# Patient Record
Sex: Male | Born: 1935 | Race: White | Hispanic: No | Marital: Married | Smoking: Former smoker
Health system: Southern US, Community
[De-identification: ages and names within clinical notes are randomized; demographics above are authoritative.]

## PROBLEM LIST (undated history)

## (undated) DIAGNOSIS — N200 Calculus of kidney: Secondary | ICD-10-CM

## (undated) DIAGNOSIS — N400 Enlarged prostate without lower urinary tract symptoms: Secondary | ICD-10-CM

## (undated) DIAGNOSIS — J45909 Unspecified asthma, uncomplicated: Secondary | ICD-10-CM

## (undated) DIAGNOSIS — Z87442 Personal history of urinary calculi: Secondary | ICD-10-CM

## (undated) DIAGNOSIS — K219 Gastro-esophageal reflux disease without esophagitis: Secondary | ICD-10-CM

## (undated) DIAGNOSIS — G4730 Sleep apnea, unspecified: Secondary | ICD-10-CM

## (undated) DIAGNOSIS — Q984 Klinefelter syndrome, unspecified: Secondary | ICD-10-CM

## (undated) DIAGNOSIS — B029 Zoster without complications: Secondary | ICD-10-CM

## (undated) DIAGNOSIS — E039 Hypothyroidism, unspecified: Secondary | ICD-10-CM

## (undated) DIAGNOSIS — M109 Gout, unspecified: Secondary | ICD-10-CM

## (undated) DIAGNOSIS — I639 Cerebral infarction, unspecified: Secondary | ICD-10-CM

## (undated) DIAGNOSIS — K5790 Diverticulosis of intestine, part unspecified, without perforation or abscess without bleeding: Secondary | ICD-10-CM

## (undated) DIAGNOSIS — A770 Spotted fever due to Rickettsia rickettsii: Secondary | ICD-10-CM

## (undated) DIAGNOSIS — I10 Essential (primary) hypertension: Secondary | ICD-10-CM

## (undated) DIAGNOSIS — J449 Chronic obstructive pulmonary disease, unspecified: Secondary | ICD-10-CM

## (undated) HISTORY — PX: JOINT REPLACEMENT: SHX530

---

## 2006-10-25 ENCOUNTER — Ambulatory Visit: Payer: Self-pay | Admitting: Internal Medicine

## 2007-01-27 ENCOUNTER — Inpatient Hospital Stay: Payer: Self-pay | Admitting: Unknown Physician Specialty

## 2007-11-14 ENCOUNTER — Ambulatory Visit: Payer: Self-pay | Admitting: Gastroenterology

## 2007-12-16 ENCOUNTER — Ambulatory Visit: Payer: Self-pay | Admitting: Gastroenterology

## 2009-05-04 ENCOUNTER — Ambulatory Visit: Payer: Self-pay | Admitting: Neurology

## 2009-11-17 ENCOUNTER — Ambulatory Visit: Payer: Self-pay | Admitting: Unknown Physician Specialty

## 2010-01-05 ENCOUNTER — Ambulatory Visit: Payer: Self-pay | Admitting: Unknown Physician Specialty

## 2010-01-09 ENCOUNTER — Ambulatory Visit: Payer: Self-pay | Admitting: Unknown Physician Specialty

## 2011-01-12 ENCOUNTER — Ambulatory Visit: Payer: Self-pay | Admitting: Gastroenterology

## 2011-01-15 LAB — PATHOLOGY REPORT

## 2011-01-19 ENCOUNTER — Ambulatory Visit: Payer: Self-pay | Admitting: Gastroenterology

## 2011-05-23 ENCOUNTER — Emergency Department: Payer: Self-pay | Admitting: Emergency Medicine

## 2011-06-08 ENCOUNTER — Ambulatory Visit: Payer: Self-pay | Admitting: Internal Medicine

## 2011-12-19 ENCOUNTER — Ambulatory Visit: Payer: Self-pay | Admitting: Unknown Physician Specialty

## 2012-01-04 ENCOUNTER — Encounter: Payer: Self-pay | Admitting: Unknown Physician Specialty

## 2012-01-11 ENCOUNTER — Encounter: Payer: Self-pay | Admitting: Unknown Physician Specialty

## 2012-08-15 ENCOUNTER — Ambulatory Visit: Payer: Self-pay | Admitting: Internal Medicine

## 2012-09-02 ENCOUNTER — Ambulatory Visit: Payer: Self-pay | Admitting: Internal Medicine

## 2012-09-17 ENCOUNTER — Ambulatory Visit: Payer: Self-pay | Admitting: Unknown Physician Specialty

## 2012-09-23 ENCOUNTER — Other Ambulatory Visit: Payer: Self-pay | Admitting: Unknown Physician Specialty

## 2012-09-23 LAB — APTT: Activated PTT: 27.6 secs (ref 23.6–35.9)

## 2012-09-23 LAB — PLATELET FUNCTION ASSAY: COL/EPI PLT FXN SCRN: 206 Seconds — ABNORMAL HIGH

## 2012-09-23 LAB — PROTIME-INR: INR: 1

## 2012-09-29 ENCOUNTER — Ambulatory Visit: Payer: Self-pay | Admitting: Unknown Physician Specialty

## 2012-10-08 ENCOUNTER — Ambulatory Visit: Payer: Self-pay | Admitting: Unknown Physician Specialty

## 2012-10-14 ENCOUNTER — Ambulatory Visit: Payer: Self-pay | Admitting: Unknown Physician Specialty

## 2012-10-15 LAB — PATHOLOGY REPORT

## 2013-06-11 ENCOUNTER — Ambulatory Visit: Payer: Self-pay | Admitting: Hematology and Oncology

## 2013-06-16 ENCOUNTER — Ambulatory Visit: Payer: Self-pay | Admitting: Gastroenterology

## 2013-06-30 ENCOUNTER — Ambulatory Visit: Payer: Self-pay | Admitting: Gastroenterology

## 2013-06-30 LAB — CBC WITH DIFFERENTIAL/PLATELET
Basophil %: 0.8 %
HCT: 38.9 % — ABNORMAL LOW (ref 40.0–52.0)
HGB: 13.1 g/dL (ref 13.0–18.0)
MCH: 30.1 pg (ref 26.0–34.0)
MCHC: 33.6 g/dL (ref 32.0–36.0)
MCV: 89 fL (ref 80–100)
Monocyte #: 0.5 x10 3/mm (ref 0.2–1.0)
Monocyte %: 8.7 %
Neutrophil %: 49.1 %
WBC: 5.3 10*3/uL (ref 3.8–10.6)

## 2013-07-02 LAB — PATHOLOGY REPORT

## 2013-07-07 ENCOUNTER — Ambulatory Visit: Payer: Self-pay | Admitting: Hematology and Oncology

## 2013-07-07 LAB — CBC CANCER CENTER
Basophil #: 0.1 x10 3/mm (ref 0.0–0.1)
Eosinophil %: 2.6 %
HCT: 38.4 % — ABNORMAL LOW (ref 40.0–52.0)
HGB: 13.3 g/dL (ref 13.0–18.0)
Lymphocyte #: 1.8 x10 3/mm (ref 1.0–3.6)
MCV: 89 fL (ref 80–100)
Monocyte #: 0.5 x10 3/mm (ref 0.2–1.0)
Neutrophil #: 4.1 x10 3/mm (ref 1.4–6.5)
Platelet: 81 x10 3/mm — ABNORMAL LOW (ref 150–440)
RBC: 4.3 10*6/uL — ABNORMAL LOW (ref 4.40–5.90)
WBC: 6.7 x10 3/mm (ref 3.8–10.6)

## 2013-07-10 ENCOUNTER — Ambulatory Visit: Payer: Self-pay | Admitting: Hematology and Oncology

## 2013-10-06 ENCOUNTER — Ambulatory Visit: Payer: Self-pay | Admitting: Hematology and Oncology

## 2013-10-06 LAB — CBC CANCER CENTER
Basophil #: 0 x10 3/mm (ref 0.0–0.1)
Eosinophil %: 2.7 %
Lymphocyte #: 1.7 x10 3/mm (ref 1.0–3.6)
MCH: 30.5 pg (ref 26.0–34.0)
MCV: 90 fL (ref 80–100)
Monocyte #: 0.5 x10 3/mm (ref 0.2–1.0)
Monocyte %: 9.5 %
Platelet: 81 x10 3/mm — ABNORMAL LOW (ref 150–440)
RBC: 4.31 10*6/uL — ABNORMAL LOW (ref 4.40–5.90)
RDW: 13.3 % (ref 11.5–14.5)
WBC: 5.4 x10 3/mm (ref 3.8–10.6)

## 2013-10-07 IMAGING — US US THYROID
1 series · 17 of 25 positions shown · non-contrast
Comparison: none

REASON FOR EXAM: thyroid nodule
COMMENTS:

[Series 1: us thyroid · 17 of 50 slices shown]
[im 1/50]
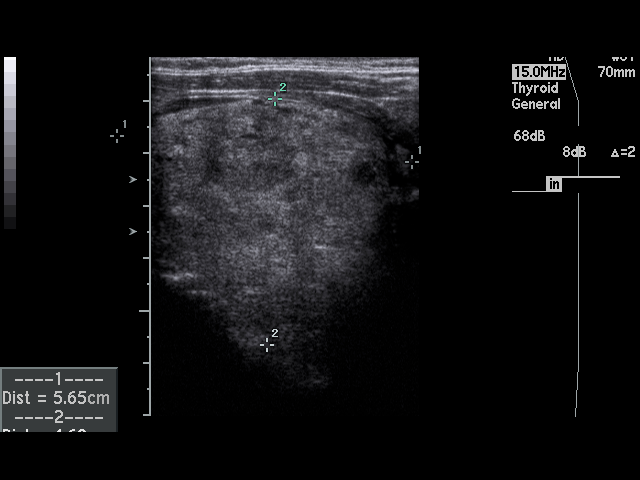
[im 5/50]
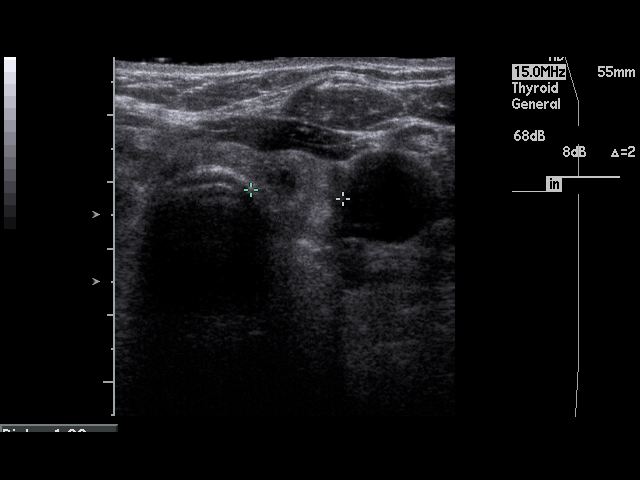
[im 7/50]
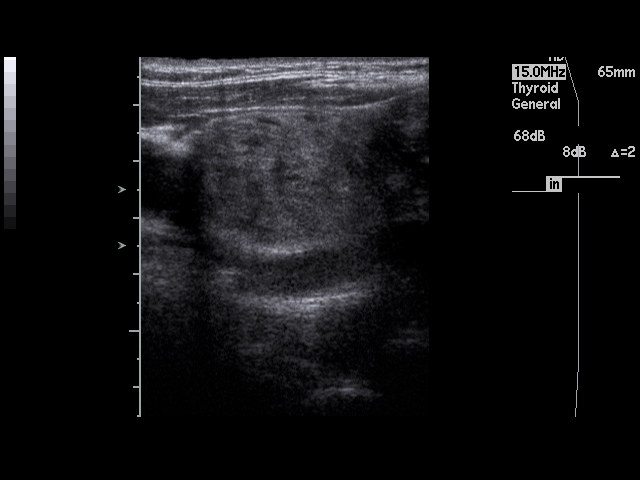
[im 11/50]
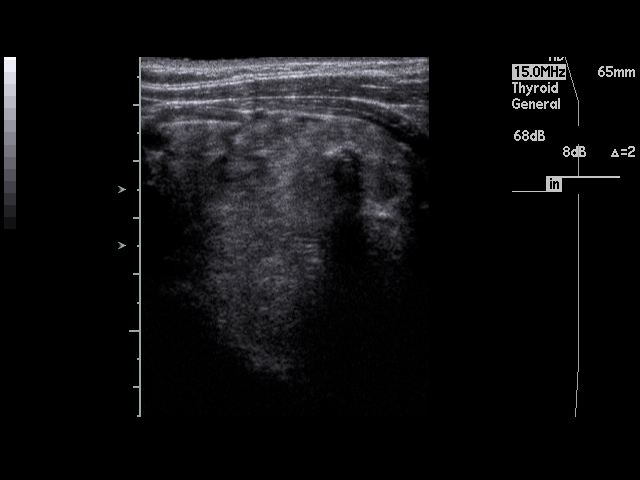
[im 13/50]
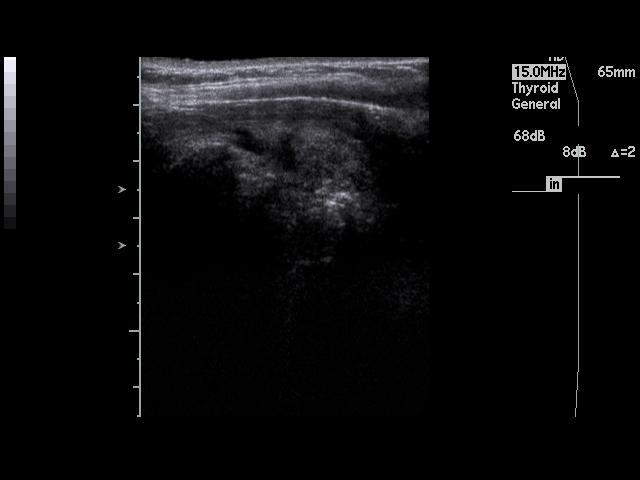
[im 17/50]
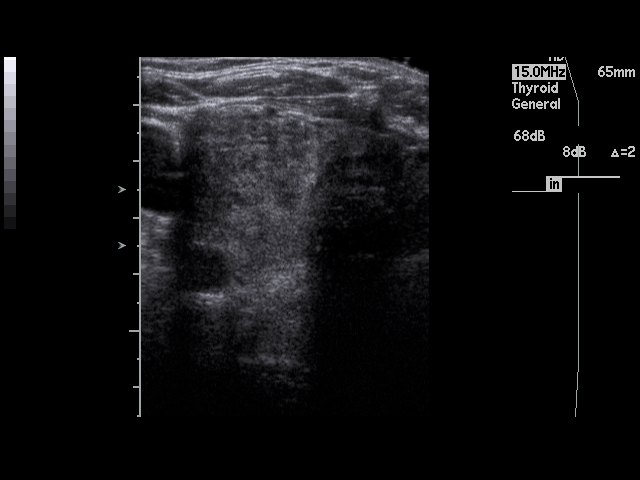
[im 19/50]
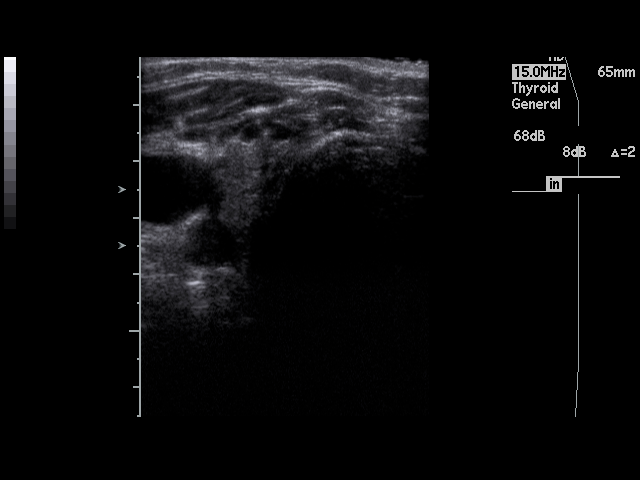
[im 23/50]
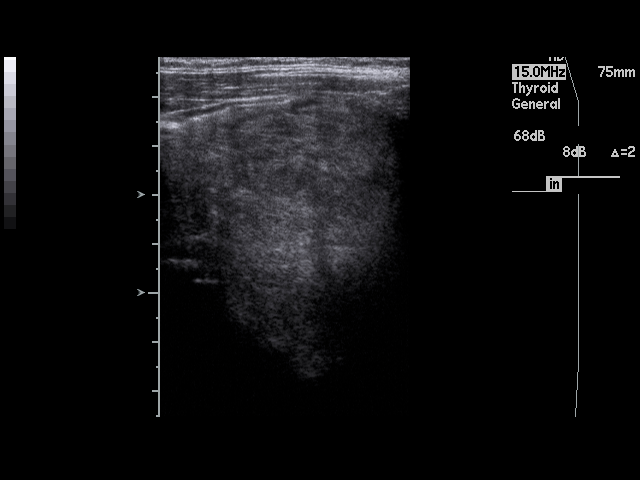
[im 25/50]
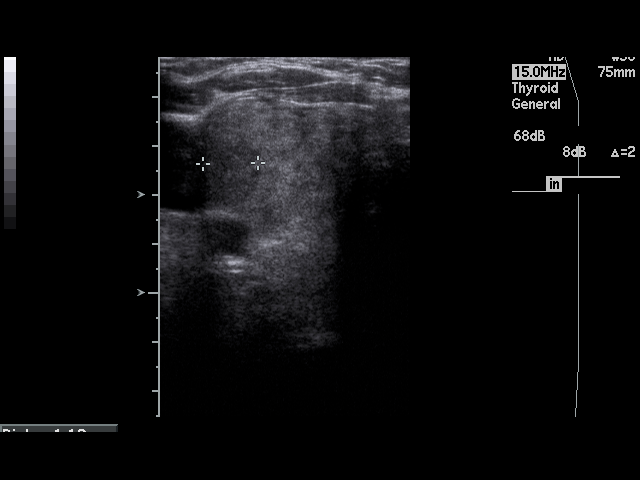
[im 27/50]
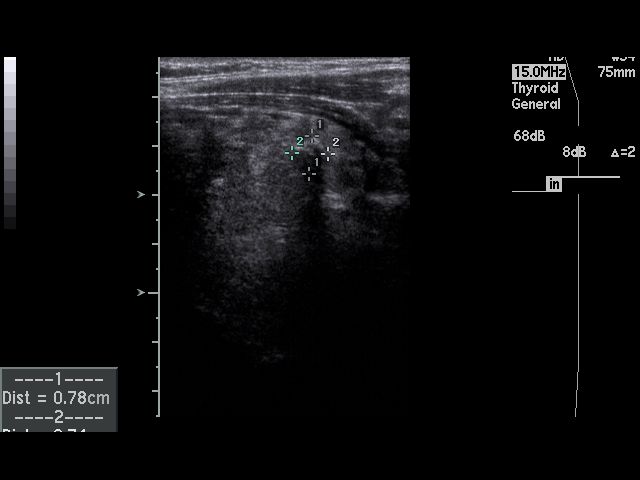
[im 31/50]
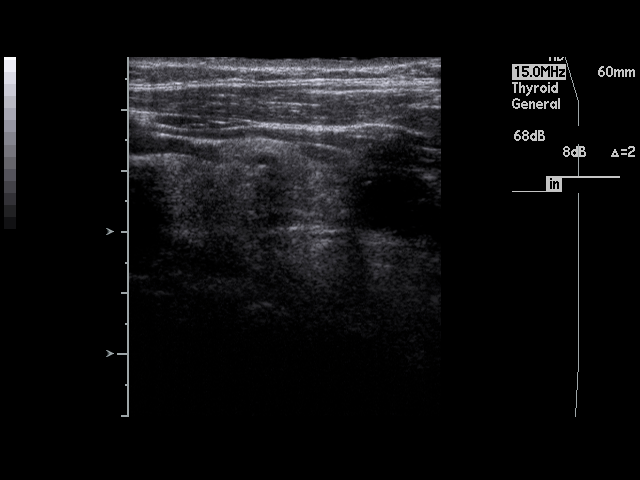
[im 33/50]
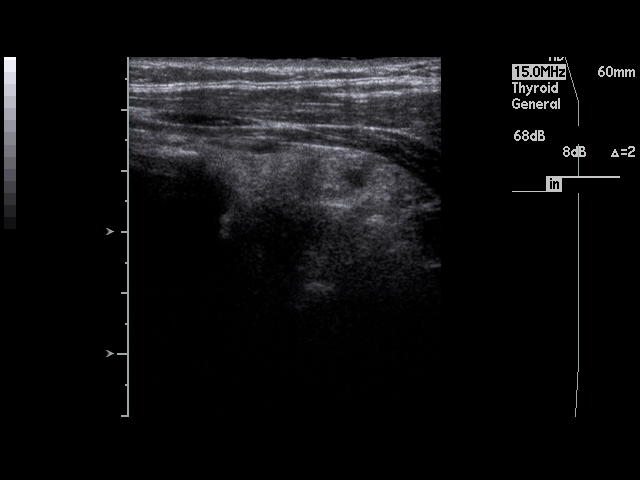
[im 37/50]
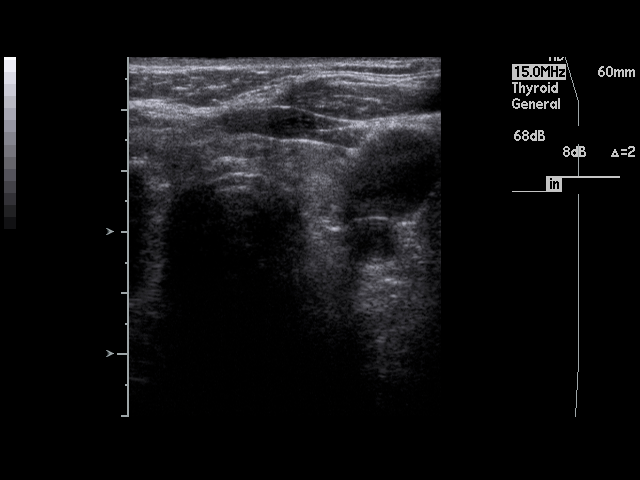
[im 39/50]
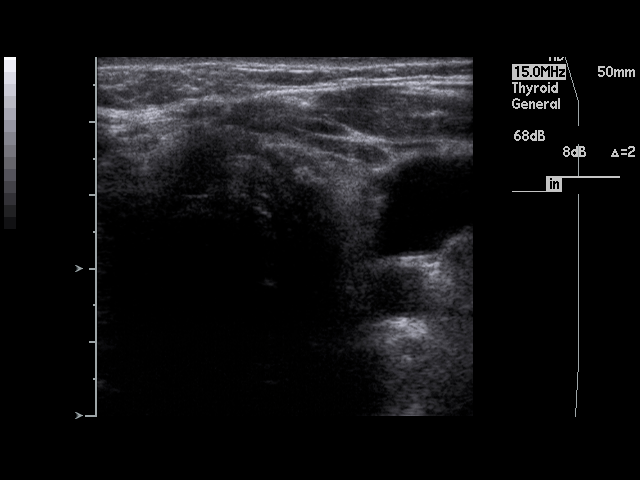
[im 43/50]
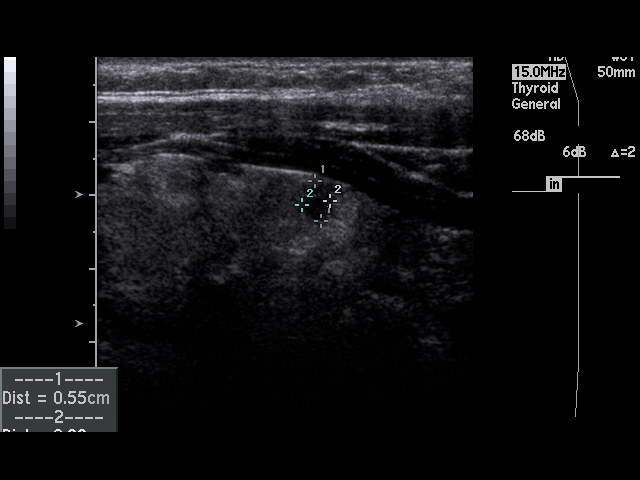
[im 45/50]
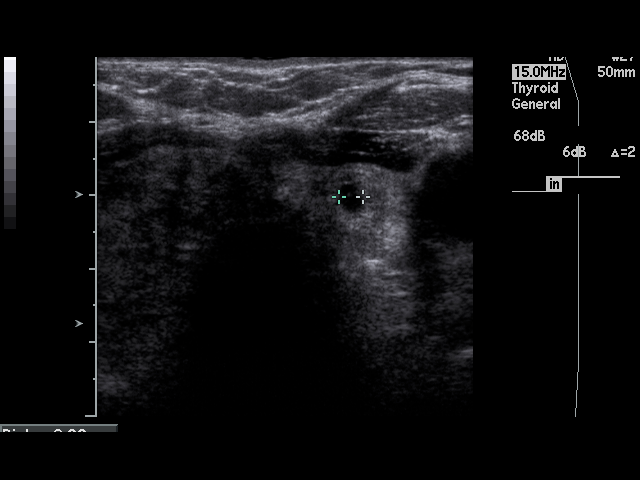
[im 50/50]
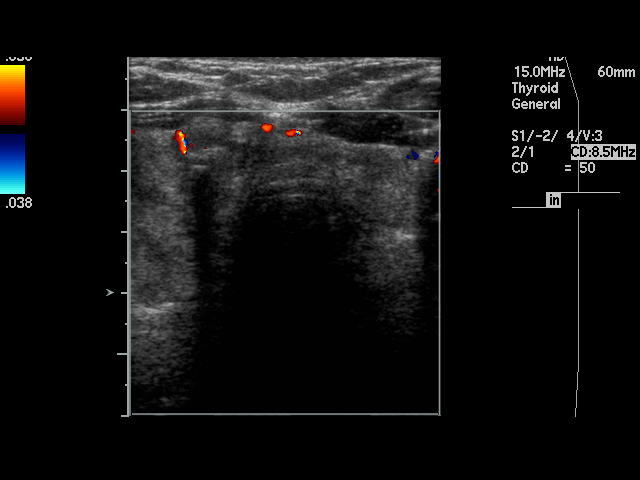

[17 of 25 positions shown; findings below may reference images not displayed]

PROCEDURE:     MDZ - MDZ THYROID  - December 19, 2011  [DATE]

RESULT:     The right lobe of the thyroid measures 5.65 cm x 4.69 cm x
cm and the left lobe measures 4.68 cm x 2.28 cm x 1.39 cm. There is a
cm hypoechoic nodule in the mid to upper pole region of the right lobe.
There is a focal 7.8 mm calcification at the lower pole of the right lobe.
No associated nodule is seen. On the left, there is a tiny lobulated or
septated nodule that is near anechoic. No associated microcalcifications are
seen. No additional nodules are seen. The thyroid echotexture bilaterally is
heterogeneous. In view of this appearance the possibility of additional
non-circumscribed nodules cannot be excluded.
IMPRESSION: 1. There is a 1.84 cm hypoechoic nodule at the mid to upper pole of the
right lobe.
2. There is a 5.5 mm anechoic or near anechoic nodule at the lower pole of
the left lobe.
3. There is a focal calcification at the lower pole that is unassociated
with a nodule.
4. The thyroid echotexture bilaterally is heterogeneous.

## 2013-10-10 ENCOUNTER — Ambulatory Visit: Payer: Self-pay | Admitting: Hematology and Oncology

## 2015-03-24 ENCOUNTER — Ambulatory Visit
Admit: 2015-03-24 | Disposition: A | Discharge status: Home / Self Care | Payer: Self-pay | Admitting: Unknown Physician Specialty

## 2015-03-29 NOTE — Op Note (Signed)
PATIENT NAME:  DOCTOR, SHEAHAN MR#:  [id number] DATE OF BIRTH:  05/26/1934  DATE OF PROCEDURE:  10/14/2012  PREOPERATIVE DIAGNOSIS: Right thyroid mass.   POSTOPERATIVE DIAGNOSIS: Right thyroid mass.   OPERATION PERFORMED:  Right hemithyroidectomy with isthmusectomy.   SURGEON: Roena Malady, MD   ASSISTANT: Mahlon Gammon, MD   OPERATIVE FINDINGS: A large multinodular mass within the right lobe of the thyroid.   DESCRIPTION OF PROCEDURE: Grason was identified in the holding area, taken to the Operating Room, and placed in the supine position. After general endotracheal anesthesia, the neck was gently extended. An incision line was marked approximately two fingerbreadths above the sternal notch. A local anesthetic of 1% lidocaine with 1:100,000 epinephrine was used to inject along this incision line. A total of 5 mL was used. With the neck prepped and draped sterilely, a 15 blade was used to incise down to and through the platysma muscle. Hemostasis was achieved using the Bovie cautery. The strap muscles were identified in the midline and gently divided using the Bovie. The strap muscles were retracted to the right. Immediately the right lobe of the thyroid was identified as a large mass or a large lobe with multiple nodules within it. The gland was gently medialized and all feeding vessels were divided using the Harmonic scalpel. The superior pole was identified and isolated and divided using the Harmonic scalpel. This allowed the gland to gently medialize. The superior and inferior parathyroid glands were identified and left on their vascular pedicles intact. The gland was then retracted more medially. Careful dissection proceeded within the tracheoesophageal groove where the recurrent laryngeal nerve was identified. This was stimulated and was left intact throughout the case. Prior to the surgery, a laryngeal monitor had been placed in the endotracheal tube and was used for monitoring throughout the  case. As the gland was then medialized, Berry's ligament was identified and divided. The gland was then peeled off the anterior tracheal wall crossing the midline, including the isthmus. The gland was then removed from its opposite lobe using the Harmonic scalpel.  I thus delivered the gland. A stitch was placed in the superior pole. There were multiple nodules throughout the gland. The gland was then inspected on the back table. There did not appear to be any parathyroid tissue identifiable. The wound was then copiously irrigated with saline. Any small bleeding points were cauterized using the microbipolar. The recurrent laryngeal nerve was stimulated at the end of the case and remained intact. A piece of Surgicel was then placed against the trachea on the right hand side. A #10 TLS drain was brought out of the wound inferiorly. The strap muscles were reapproximated midline using 4-0 Vicryl. The platysma was closed using 4-0 Vicryl, and the skin was closed using subcutaneous tissues using 4-0 Vicryl, and the skin was closed using Dermabond. A Tegaderm was then placed over the drain to keep it in place. This was then fixed to the suction tubing to prevent hematoma. The patient was then returned to Anesthesia where he was awakened in the Operating Room and taken to the recovery room in stable condition.   CULTURES: None.   SPECIMENS: Right thyroid lobe and isthmus.   ESTIMATED BLOOD LOSS: Less than 20 mL.  ____________________________ Roena Malady, MD ctm:cbb D: 10/14/2012 12:11:15 ET T: 10/14/2012 12:17:35 ET JOB#: [id number]  cc: Roena Malady, MD, <Dictator> Roena Malady MD ELECTRONICALLY SIGNED 10/21/2012 17:27

## 2015-06-23 ENCOUNTER — Encounter: Payer: Self-pay | Admitting: Emergency Medicine

## 2015-06-23 ENCOUNTER — Emergency Department: Payer: PPO

## 2015-06-23 ENCOUNTER — Emergency Department
Admission: EM | Admit: 2015-06-23 | Discharge: 2015-06-23 | Disposition: A | Discharge status: Home / Self Care | Payer: PPO | Attending: Emergency Medicine | Admitting: Emergency Medicine

## 2015-06-23 DIAGNOSIS — Z23 Encounter for immunization: Secondary | ICD-10-CM | POA: Insufficient documentation

## 2015-06-23 DIAGNOSIS — Z87891 Personal history of nicotine dependence: Secondary | ICD-10-CM | POA: Diagnosis not present

## 2015-06-23 DIAGNOSIS — S0990XA Unspecified injury of head, initial encounter: Secondary | ICD-10-CM | POA: Diagnosis present

## 2015-06-23 DIAGNOSIS — Y998 Other external cause status: Secondary | ICD-10-CM | POA: Insufficient documentation

## 2015-06-23 DIAGNOSIS — Y9289 Other specified places as the place of occurrence of the external cause: Secondary | ICD-10-CM | POA: Diagnosis not present

## 2015-06-23 DIAGNOSIS — Y9389 Activity, other specified: Secondary | ICD-10-CM | POA: Insufficient documentation

## 2015-06-23 DIAGNOSIS — W010XXA Fall on same level from slipping, tripping and stumbling without subsequent striking against object, initial encounter: Secondary | ICD-10-CM | POA: Insufficient documentation

## 2015-06-23 DIAGNOSIS — S0101XA Laceration without foreign body of scalp, initial encounter: Secondary | ICD-10-CM | POA: Insufficient documentation

## 2015-06-23 HISTORY — DX: Chronic obstructive pulmonary disease, unspecified: J44.9

## 2015-06-23 HISTORY — DX: Unspecified asthma, uncomplicated: J45.909

## 2015-06-23 MED ORDER — ACETAMINOPHEN 500 MG PO TABS
ORAL_TABLET | ORAL | Status: AC
  Administered 2015-06-23: 1000 mg via ORAL
  Filled 2015-06-23: qty 2

## 2015-06-23 MED ORDER — LIDOCAINE-EPINEPHRINE (PF) 1 %-1:200000 IJ SOLN
INTRAMUSCULAR | Status: AC
  Filled 2015-06-23: qty 30

## 2015-06-23 MED ORDER — ACETAMINOPHEN 500 MG PO TABS
1000.0000 mg | ORAL_TABLET | Freq: Once | ORAL | Status: AC
  Administered 2015-06-23: 1000 mg via ORAL

## 2015-06-23 MED ORDER — TETANUS-DIPHTH-ACELL PERTUSSIS 5-2.5-18.5 LF-MCG/0.5 IM SUSP
0.5000 mL | Freq: Once | INTRAMUSCULAR | Status: AC
  Administered 2015-06-23: 0.5 mL via INTRAMUSCULAR

## 2015-06-23 MED ORDER — TETANUS-DIPHTH-ACELL PERTUSSIS 5-2.5-18.5 LF-MCG/0.5 IM SUSP
INTRAMUSCULAR | Status: AC
  Administered 2015-06-23: 0.5 mL via INTRAMUSCULAR
  Filled 2015-06-23: qty 0.5

## 2015-06-23 NOTE — ED Provider Notes (Signed)
[facility] Emergency Department Provider Note     Time seen: ----------------------------------------- 6:22 PM on 06/23/2015 -----------------------------------------    I have reviewed the triage vital signs and the nursing notes.   HISTORY  Chief Complaint Head Laceration    HPI Richard Fry is a 81 y.o. male who presents ER after a fall. Patient states he sustained a laceration above his right eyebrow. Patient states he was on the telephone and his legs got tangled and he fell, thinks he may have hit his head on the floor. He denies any significant headache, denies taking blood thinners. He is unsure when his last vaccination was.   Past Medical History  Diagnosis Date  . COPD (chronic obstructive pulmonary disease)   . Asthma     There are no active problems to display for this patient.   History reviewed. No pertinent past surgical history.  Allergies Review of patient's allergies indicates no known allergies.  Social History History  Substance Use Topics  . Smoking status: Former Smoker    Types: Cigarettes  . Smokeless tobacco: Never Used  . Alcohol Use: No    Review of Systems Constitutional: Negative for fever. Eyes: Negative for visual changes. ENT: Negative for sore throat. Cardiovascular: Negative for chest pain. Respiratory: Negative for shortness of breath. Gastrointestinal: Negative for abdominal pain, vomiting and diarrhea. Genitourinary: Negative for dysuria. Musculoskeletal: Negative for back pain. Skin: Negative for rash. Neurological: Negative for headaches, focal weakness or numbness.  10-point ROS otherwise negative.  ____________________________________________   PHYSICAL EXAM:  VITAL SIGNS: ED Triage Vitals  Enc Vitals Group     BP 06/23/15 1733 137/66 mmHg     Pulse Rate 06/23/15 1733 78     Resp 06/23/15 1733 18     Temp 06/23/15 1733 98.4 F (36.9 C)     Temp src --      SpO2 06/23/15 1733 94  %     Weight --      Height --      Head Cir --      Peak Flow --      Pain Score 06/23/15 1735 4     Pain Loc --      Pain Edu? --      Excl. in [place]? --     Constitutional: Alert and oriented. Well appearing and in no distress. Eyes: Conjunctivae are normal. PERRL. Normal extraocular movements. ENT   Head: 5 cm laceration noted superior to the right eyebrow.   Nose: No congestion/rhinnorhea.   Mouth/Throat: Mucous membranes are moist.   Neck: No stridor. Hematological/Lymphatic/Immunilogical: No cervical lymphadenopathy. Cardiovascular: Normal rate, regular rhythm. Normal and symmetric distal pulses are present in all extremities. No murmurs, rubs, or gallops. Respiratory: Normal respiratory effort without tachypnea nor retractions. Breath sounds are clear and equal bilaterally. No wheezes/rales/rhonchi. Gastrointestinal: Soft and nontender. No distention. No abdominal bruits. There is no CVA tenderness. Musculoskeletal: Nontender with normal range of motion in all extremities. No joint effusions.  No lower extremity tenderness nor edema. Neurologic:  Normal speech and language. No gross focal neurologic deficits are appreciated. Speech is normal. No gait instability. Skin:  5 cm laceration as noted above the right eyebrow, Y-shaped Psychiatric: Mood and affect are normal. Speech and behavior are normal. Patient exhibits appropriate insight and judgment.  ____________________________________________  ED COURSE:  Pertinent labs & imaging results that were available during my care of the patient were reviewed by me and considered in my medical decision making (see chart  for details). We'll obtain CT head, laceration repair and T [place]. ____________________________________________ RADIOLOGY Images were viewed by me  CT head without traumatic injury   LACERATION REPAIR Performed by: Earleen Newport Authorized by: Lenise Arena E Consent: Verbal consent  obtained. Risks and benefits: risks, benefits and alternatives were discussed Consent given by: patient Patient identity confirmed: provided demographic data Prepped and Draped in normal sterile fashion Wound explored  Laceration Location: Right frontal scalp  Laceration Length: 5 cm, complex Y-shaped  No Foreign Bodies seen or palpated  Anesthesia: local infiltration  Local anesthetic: lidocaine 1 % with epinephrine  Anesthetic total: 5 ml  Irrigation method: syringe Amount of cleaning: standard  Skin closure: 5-0 Ethilon   Number of sutures: 15  Technique: Running   Patient tolerance: Patient tolerated the procedure well with no immediate complications. ____________________________________________  FINAL ASSESSMENT AND PLAN  Fall, head injury, complex scalp laceration  Plan: Wound was repaired as dictated above. CT scan findings as dictated above. Patient being discharged with close follow-up reevaluation, suture removal in around 7 days.   Earleen Newport, MD   Earleen Newport, MD 06/23/15 [phone]

## 2015-06-23 NOTE — ED Notes (Signed)
MD at bedside. 

## 2015-06-23 NOTE — Discharge Instructions (Signed)
Head Injury °You have received a head injury. It does not appear serious at this time. Headaches and vomiting are common following head injury. It should be easy to awaken from sleeping. Sometimes it is necessary for you to stay in the emergency department for a while for observation. Sometimes admission to the hospital may be needed. After injuries such as yours, most problems occur within the first 24 hours, but side effects may occur up to 7-10 days after the injury. It is important for you to carefully monitor your condition and contact your health care provider or seek immediate medical care if there is a change in your condition. °WHAT ARE THE TYPES OF HEAD INJURIES? °Head injuries can be as minor as a bump. Some head injuries can be more severe. More severe head injuries include: °· A jarring injury to the brain (concussion). °· A bruise of the brain (contusion). This mean there is bleeding in the brain that can cause swelling. °· A cracked skull (skull fracture). °· Bleeding in the brain that collects, clots, and forms a bump (hematoma). °WHAT CAUSES A HEAD INJURY? °A serious head injury is most likely to happen to someone who is in a car wreck and is not wearing a seat belt. Other causes of major head injuries include bicycle or motorcycle accidents, sports injuries, and falls. °HOW ARE HEAD INJURIES DIAGNOSED? °A complete history of the event leading to the injury and your current symptoms will be helpful in diagnosing head injuries. Many times, pictures of the brain, such as CT or MRI are needed to see the extent of the injury. Often, an overnight hospital stay is necessary for observation.  °WHEN SHOULD I SEEK IMMEDIATE MEDICAL CARE?  °You should get help right away if: °· You have confusion or drowsiness. °· You feel sick to your stomach (nauseous) or have continued, forceful vomiting. °· You have dizziness or unsteadiness that is getting worse. °· You have severe, continued headaches not relieved by  medicine. Only take over-the-counter or prescription medicines for pain, fever, or discomfort as directed by your health care provider. °· You do not have normal function of the arms or legs or are unable to walk. °· You notice changes in the black spots in the center of the colored part of your eye (pupil). °· You have a clear or bloody fluid coming from your nose or ears. °· You have a loss of vision. °During the next 24 hours after the injury, you must stay with someone who can watch you for the warning signs. This person should contact local emergency services (911 in the [place] if you have seizures, you become unconscious, or you are unable to wake up. °HOW CAN I PREVENT A HEAD INJURY IN THE FUTURE? °The most important factor for preventing major head injuries is avoiding motor vehicle accidents.  To minimize the potential for damage to your head, it is crucial to wear seat belts while riding in motor vehicles. Wearing helmets while bike riding and playing collision sports (like football) is also helpful. Also, avoiding dangerous activities around the house will further help reduce your risk of head injury.  °WHEN CAN I RETURN TO NORMAL ACTIVITIES AND ATHLETICS? °You should be reevaluated by your health care provider before returning to these activities. If you have any of the following symptoms, you should not return to activities or contact sports until 1 week after the symptoms have stopped: °· Persistent headache. °· Dizziness or vertigo. °· Poor attention and concentration. °· Confusion. °·   Memory problems.  Nausea or vomiting.  Fatigue or tire easily.  Irritability.  Intolerant of bright lights or loud noises.  Anxiety or depression.  Disturbed sleep. MAKE SURE YOU:   Understand these instructions.  Will watch your condition.  Will get help right away if you are not doing well or get worse. Document Released: 11/26/2005 Document Revised: 12/01/2013 Document Reviewed:  08/03/2013 [facility] Patient Information 2015 [place], [place]. This information is not intended to replace advice given to you by your health care provider. Make sure you discuss any questions you have with your health care provider.  Laceration Care, Adult A laceration is a cut or lesion that goes through all layers of the skin and into the tissue just beneath the skin. TREATMENT  Some lacerations may not require closure. Some lacerations may not be able to be closed due to an increased risk of infection. It is important to see your caregiver as soon as possible after an injury to minimize the risk of infection and maximize the opportunity for successful closure. If closure is appropriate, pain medicines may be given, if needed. The wound will be cleaned to help prevent infection. Your caregiver will use stitches (sutures), staples, wound glue (adhesive), or skin adhesive strips to repair the laceration. These tools bring the skin edges together to allow for faster healing and a better cosmetic outcome. However, all wounds will heal with a scar. Once the wound has healed, scarring can be minimized by covering the wound with sunscreen during the day for 1 full year. HOME CARE INSTRUCTIONS  For sutures or staples:  Keep the wound clean and dry.  If you were given a bandage (dressing), you should change it at least once a day. Also, change the dressing if it becomes wet or dirty, or as directed by your caregiver.  Wash the wound with soap and water 2 times a day. Rinse the wound off with water to remove all soap. Pat the wound dry with a clean towel.  After cleaning, apply a thin layer of the antibiotic ointment as recommended by your caregiver. This will help prevent infection and keep the dressing from sticking.  You may shower as usual after the first 24 hours. Do not soak the wound in water until the sutures are removed.  Only take over-the-counter or prescription medicines for pain,  discomfort, or fever as directed by your caregiver.  Get your sutures or staples removed as directed by your caregiver. For skin adhesive strips:  Keep the wound clean and dry.  Do not get the skin adhesive strips wet. You may bathe carefully, using caution to keep the wound dry.  If the wound gets wet, pat it dry with a clean towel.  Skin adhesive strips will fall off on their own. You may trim the strips as the wound heals. Do not remove skin adhesive strips that are still stuck to the wound. They will fall off in time. For wound adhesive:  You may briefly wet your wound in the shower or bath. Do not soak or scrub the wound. Do not swim. Avoid periods of heavy perspiration until the skin adhesive has fallen off on its own. After showering or bathing, gently pat the wound dry with a clean towel.  Do not apply liquid medicine, cream medicine, or ointment medicine to your wound while the skin adhesive is in place. This may loosen the film before your wound is healed.  If a dressing is placed over the wound, be careful not  to apply tape directly over the skin adhesive. This may cause the adhesive to be pulled off before the wound is healed.  Avoid prolonged exposure to sunlight or tanning lamps while the skin adhesive is in place. Exposure to ultraviolet light in the first year will darken the scar.  The skin adhesive will usually remain in place for 5 to 10 days, then naturally fall off the skin. Do not pick at the adhesive film. You may need a tetanus shot if:  You cannot remember when you had your last tetanus shot.  You have never had a tetanus shot. If you get a tetanus shot, your arm may swell, get red, and feel warm to the touch. This is common and not a problem. If you need a tetanus shot and you choose not to have one, there is a rare chance of getting tetanus. Sickness from tetanus can be serious. SEEK MEDICAL CARE IF:   You have redness, swelling, or increasing pain in the  wound.  You see a red line that goes away from the wound.  You have yellowish-white fluid (pus) coming from the wound.  You have a fever.  You notice a bad smell coming from the wound or dressing.  Your wound breaks open before or after sutures have been removed.  You notice something coming out of the wound such as wood or glass.  Your wound is on your hand or foot and you cannot move a finger or toe. SEEK IMMEDIATE MEDICAL CARE IF:   Your pain is not controlled with prescribed medicine.  You have severe swelling around the wound causing pain and numbness or a change in color in your arm, hand, leg, or foot.  Your wound splits open and starts bleeding.  You have worsening numbness, weakness, or loss of function of any joint around or beyond the wound.  You develop painful lumps near the wound or on the skin anywhere on your body. MAKE SURE YOU:   Understand these instructions.  Will watch your condition.  Will get help right away if you are not doing well or get worse. Document Released: 11/26/2005 Document Revised: 02/18/2012 Document Reviewed: 05/22/2011 [facility] Patient Information 2015 [place], [place]. This information is not intended to replace advice given to you by your health care provider. Make sure you discuss any questions you have with your health care provider.

## 2015-06-23 NOTE — ED Notes (Addendum)
Pt presents to ED for fall. Estimated 2cm/5cm laceration noted above right eyebrow. Bleeding controlled. Pt states he was in telephone room and his legs got tangled and fell. He thinks he might hit his on door. Pt reports localize pain at site. Denies headache. Denies taking blood thinner; unknown TdAP vaccine.

## 2015-06-23 NOTE — ED Notes (Signed)
Patient transported to CT 

## 2015-06-30 ENCOUNTER — Encounter: Payer: Self-pay | Admitting: Emergency Medicine

## 2015-06-30 ENCOUNTER — Emergency Department
Admission: EM | Admit: 2015-06-30 | Discharge: 2015-06-30 | Disposition: A | Discharge status: Home / Self Care | Payer: PPO | Attending: Emergency Medicine | Admitting: Emergency Medicine

## 2015-06-30 DIAGNOSIS — W1839XD Other fall on same level, subsequent encounter: Secondary | ICD-10-CM | POA: Insufficient documentation

## 2015-06-30 DIAGNOSIS — Z4802 Encounter for removal of sutures: Secondary | ICD-10-CM | POA: Insufficient documentation

## 2015-06-30 DIAGNOSIS — Z87891 Personal history of nicotine dependence: Secondary | ICD-10-CM | POA: Diagnosis not present

## 2015-06-30 DIAGNOSIS — S01111D Laceration without foreign body of right eyelid and periocular area, subsequent encounter: Secondary | ICD-10-CM | POA: Insufficient documentation

## 2015-06-30 MED ORDER — BACITRACIN-NEOMYCIN-POLYMYXIN 400-5-5000 EX OINT
TOPICAL_OINTMENT | CUTANEOUS | Status: AC
  Filled 2015-06-30: qty 2

## 2015-06-30 NOTE — Discharge Instructions (Signed)

## 2015-06-30 NOTE — ED Provider Notes (Signed)
[facility] Emergency Department Provider Note  ____________________________________________  Time seen: Approximately 4:00 PM  I have reviewed the triage vital signs and the nursing notes.   HISTORY  Chief Complaint Suture / Staple Removal   HPI Richard Fry is a 81 y.o. male presents to ER for complaint of suture removal.Shunt and family reports that patient was seen here on 06/23/2015 after falling which caused laceration. Patient had laceration repaired at that visit in ER. Patient reports that he is here today for suture removal. Reports laceration is healing well without complaints. Patient denies fever, drainage, redness, vision changes, pain or other complaints reports that he feels well. Patient denies other fall.   Past Medical History  Diagnosis Date  . COPD (chronic obstructive pulmonary disease)   . Asthma     There are no active problems to display for this patient.   History reviewed. No pertinent past surgical history.  No current outpatient prescriptions on file.  Allergies Review of patient's allergies indicates no known allergies.  History reviewed. No pertinent family history.  Social History History  Substance Use Topics  . Smoking status: Former Smoker    Types: Cigarettes  . Smokeless tobacco: Never Used  . Alcohol Use: No    Review of Systems Constitutional: No fever/chills Eyes: No visual changes. ENT: No sore throat. Cardiovascular: Denies chest pain. Respiratory: Denies shortness of breath. Gastrointestinal: No abdominal pain.  No nausea, no vomiting.  No diarrhea.  No constipation. Genitourinary: Negative for dysuria. Musculoskeletal: Negative for back pain. Skin: Negative for rash. Healing laceration to right eyebrow Neurological: Negative for headaches, focal weakness or numbness.  10-point ROS otherwise negative.  ____________________________________________   PHYSICAL EXAM:  VITAL SIGNS: ED Triage  Vitals  Enc Vitals Group     BP 06/30/15 1551 125/61 mmHg     Pulse Rate 06/30/15 1551 69     Resp 06/30/15 1551 20     Temp 06/30/15 1551 97.8 F (36.6 C)     Temp Source 06/30/15 1551 Oral     SpO2 06/30/15 1551 99 %     Weight 06/30/15 1551 175 lb (79.379 kg)     Height 06/30/15 1551 5\' 10"  (1.778 m)     Head Cir --      Peak Flow --      Pain Score --      Pain Loc --      Pain Edu? --      Excl. in [place]? --     Constitutional: Alert and oriented. Well appearing and in no acute distress. Eyes: Conjunctivae are normal. PERRL. EOMI. Head: Atraumatic. Right inferior orbit minimal healing ecchymosis, nontender. See skin below.   Nose: No congestion/rhinnorhea.  Mouth/Throat: Mucous membranes are moist.  Oropharynx non-erythematous. Neck: No stridor.  No cervical spine tenderness to palpation. Hematological/Lymphatic/Immunilogical: No cervical lymphadenopathy. Cardiovascular: Normal rate, regular rhythm. Grossly normal heart sounds.  Good peripheral circulation. Respiratory: Normal respiratory effort.  No retractions. Lungs CTAB. Gastrointestinal: Soft and nontender.  Musculoskeletal: No lower or upper extremity tenderness nor edema.  Neurologic:  Normal speech and language. No gross focal neurologic deficits are appreciated. No gait instability.  Skin:  Skin is warm, dry and intact. No rash noted. Psychiatric: Mood and affect are normal. Speech and behavior are normal.  ____________________________________________   LABS (all labs ordered are listed, but only abnormal results are displayed)  Labs Reviewed - No data to display   PROCEDURES  Procedure(s) performed:  Performed by RN Well approximated  and healing laceration. No erythema, swelling or drainage. No signs of infection.  Sutures removed: x 13 of running sutures.  Patient tolerated well and laceration well approximated post removal.   __________________________   INITIAL IMPRESSION / ASSESSMENT AND PLAN / ED  COURSE  Pertinent labs & imaging results that were available during my care of the patient were reviewed by me and considered in my medical decision making (see chart for details).  Patient presents for suture removal. The wound is well healed without signs of infection.  The sutures are removed. Wound care and activity instructions given. Return prn. ____________________________________________   FINAL CLINICAL IMPRESSION(S) / ED DIAGNOSES  Final diagnoses:  Visit for suture removal       Marylene Land, NP 06/30/15 [place], MD 06/30/15 [id number]

## 2015-06-30 NOTE — ED Notes (Signed)
Sutures to right forehead, removal

## 2015-08-10 ENCOUNTER — Other Ambulatory Visit: Payer: Self-pay

## 2015-08-10 ENCOUNTER — Emergency Department: Payer: PPO

## 2015-08-10 ENCOUNTER — Emergency Department
Admission: EM | Admit: 2015-08-10 | Discharge: 2015-08-10 | Disposition: A | Discharge status: Other Acute Inpatient Hospital | Payer: PPO | Attending: Emergency Medicine | Admitting: Emergency Medicine

## 2015-08-10 ENCOUNTER — Encounter: Payer: Self-pay | Admitting: *Deleted

## 2015-08-10 DIAGNOSIS — Z79899 Other long term (current) drug therapy: Secondary | ICD-10-CM | POA: Diagnosis not present

## 2015-08-10 DIAGNOSIS — Z87891 Personal history of nicotine dependence: Secondary | ICD-10-CM | POA: Insufficient documentation

## 2015-08-10 DIAGNOSIS — Y998 Other external cause status: Secondary | ICD-10-CM | POA: Insufficient documentation

## 2015-08-10 DIAGNOSIS — Z7982 Long term (current) use of aspirin: Secondary | ICD-10-CM | POA: Insufficient documentation

## 2015-08-10 DIAGNOSIS — Y9389 Activity, other specified: Secondary | ICD-10-CM | POA: Insufficient documentation

## 2015-08-10 DIAGNOSIS — S8992XA Unspecified injury of left lower leg, initial encounter: Secondary | ICD-10-CM | POA: Insufficient documentation

## 2015-08-10 DIAGNOSIS — W11XXXA Fall on and from ladder, initial encounter: Secondary | ICD-10-CM | POA: Diagnosis not present

## 2015-08-10 DIAGNOSIS — S0990XA Unspecified injury of head, initial encounter: Secondary | ICD-10-CM | POA: Diagnosis present

## 2015-08-10 DIAGNOSIS — S0101XA Laceration without foreign body of scalp, initial encounter: Secondary | ICD-10-CM | POA: Diagnosis not present

## 2015-08-10 DIAGNOSIS — S02119A Unspecified fracture of occiput, initial encounter for closed fracture: Secondary | ICD-10-CM | POA: Diagnosis not present

## 2015-08-10 DIAGNOSIS — Y9289 Other specified places as the place of occurrence of the external cause: Secondary | ICD-10-CM | POA: Insufficient documentation

## 2015-08-10 DIAGNOSIS — S0291XA Unspecified fracture of skull, initial encounter for closed fracture: Secondary | ICD-10-CM

## 2015-08-10 DIAGNOSIS — S065X0A Traumatic subdural hemorrhage without loss of consciousness, initial encounter: Secondary | ICD-10-CM | POA: Diagnosis not present

## 2015-08-10 NOTE — ED Provider Notes (Signed)
[facility] Emergency Department Provider Note ____________________________________________  Time seen: 1955  I have reviewed the triage vital signs and the nursing notes.  HISTORY  Chief Complaint  Laceration  HPI Richard Fry is a 81 y.o. male who reports to the ED accompanied by his wife, after he sustained injuries following a fall off of a ladder. He was cutting tree limbs when he fell approximately 3 feet from a ladder, falling on his back and hitting the back of his head on the concrete. The wife was present and witnessed the fall, she denies any loss of consciousness at that time. She does admit to hearing is his head hit the concrete and noted a large crack. She reports that he was days however never did lose consciousness. He did not have any nausea, or vomiting following his fall. His only other complaints are some mild pain to the left knee and some discomfort to the right side of his mid back. He rates his pain at a 2/10 during the exam.   Past Medical History  Diagnosis Date  . COPD (chronic obstructive pulmonary disease)   . Asthma    There are no active problems to display for this patient.  No past surgical history on file.  Current Outpatient Rx  Name  Route  Sig  Dispense  Refill  . aspirin 325 MG tablet   Oral   Take 325 mg by mouth daily.         . fexofenadine (ALLEGRA) 180 MG tablet   Oral   Take 180 mg by mouth daily.           Allergies Review of patient's allergies indicates no known allergies.  No family history on file.  Social History Social History  Substance Use Topics  . Smoking status: Former Smoker    Types: Cigarettes  . Smokeless tobacco: Never Used  . Alcohol Use: No   Review of Systems  Constitutional: Negative for fever. Eyes: Negative for visual changes. ENT: Negative for sore throat. Cardiovascular: Negative for chest pain. Respiratory: Negative for shortness of breath. Gastrointestinal: Negative  for abdominal pain, vomiting and diarrhea. Genitourinary: Negative for dysuria. Musculoskeletal: Positive for mid-back pain. Mild left knee pain Skin: Negative for rash. Posterior scalp laceration/abrasion Neurological: Negative for headaches, focal weakness or numbness. ____________________________________________  PHYSICAL EXAM:  VITAL SIGNS: ED Triage Vitals  Enc Vitals Group     BP 08/10/15 1918 134/66 mmHg     Pulse Rate 08/10/15 1918 75     Resp 08/10/15 1918 20     Temp 08/10/15 1918 98.4 F (36.9 C)     Temp Source 08/10/15 1918 Oral     SpO2 08/10/15 1918 99 %     Weight 08/10/15 1918 174 lb (78.926 kg)     Height 08/10/15 1918 5\' 9"  (1.753 m)     Head Cir --      Peak Flow --      Pain Score --      Pain Loc --      Pain Edu? --      Excl. in [place]? --    Constitutional: Alert and oriented. Well appearing and in no distress. Eyes: Conjunctivae are normal. PERRL. Normal extraocular movements. ENT   Head: Normocephalic and atraumatic, except for posterior abrasion to the occiput.   Nose: No congestion/rhinnorhea.   Mouth/Throat: Mucous membranes are moist.   Neck: Supple. No thyromegaly. Hematological/Lymphatic/Immunilogical: No cervical lymphadenopathy. Cardiovascular: Normal rate, regular rhythm.  Respiratory: Normal  respiratory effort. No wheezes/rales/rhonchi. Gastrointestinal: Soft and nontender. No distention. Musculoskeletal: Normal spinal alignment without deformity, spasm, midline tenderness, or step-off. Nontender with normal range of motion in all extremities.  Neurologic: CN II-XII grossly intact. Normal grip bilaterally. Normal gait without ataxia. Normal speech and language. No gross focal neurologic deficits are appreciated. Skin:  Skin is warm, dry and intact. No rash noted. Psychiatric: Mood and affect are normal. Patient exhibits appropriate insight and judgment. ____________________________________________   LABS (pertinent  positives/negatives)  ____________________________________________  EKG  ____________________________________________   RADIOLOGY Head CT IMPRESSION: Small acute interhemispheric subdural hemorrhage, measuring 4 mm in maximal thickness (image 23) in the setting of right posterior occipital skull fracture.  Critical Value/emergent results were called by telephone at the time of interpretation on 08/10/2015 at 7:47 pm to Dr. Carrie Mew , who verbally acknowledged these results  I, Makenleigh Crownover, Dannielle Karvonen, personally viewed and evaluated these images (plain radiographs) as part of my medical decision making.  ____________________________________________  PROCEDURES  IV saline lock ____________________________________________  INITIAL IMPRESSION / [place] / ED COURSE  Radiology results provided and explained to patient and wife. She requests referral to [facility]. They both verbalize understanding of the diagnosis, prognosis, and need to transfer care to the Trauma Service & Neurology consultation.  ----------------------------------------- 8:48 PM on 08/10/2015 ----------------------------------------- S/W Arelia Longest, MD ([facility]), he will accept as ED transfer, repeat head CT in 6 hours, and discharge if he remains neurologically stable.  ____________________________________________  FINAL CLINICAL IMPRESSION(S) / ED DIAGNOSES  Final diagnoses:  Subdural hemorrhage following injury, without loss of consciousness, initial encounter  Skull fracture, closed, initial encounter  Fall from ladder, initial encounter     Melvenia Needles, PA-C 08/10/15 2129  Hinda Kehr, MD 08/10/15 [id number]  Hinda Kehr, MD 08/10/15 [phone]

## 2015-08-10 NOTE — ED Notes (Signed)
Patient arrived to room 1.  Previous nurse calling report to [facility] then will give this RN report. Patient alert and oriented X 3. PERRL.  No weakness noted.  Pt c/o lower thoracic pain to the right of the spine.  Moves all extremities.

## 2015-08-10 NOTE — ED Provider Notes (Signed)
Medical screening examination/treatment/procedure(s) were conducted as a shared visit with non-physician practitioner(s) and myself.  I personally evaluated the patient during the encounter.  ED ECG REPORT I, Undra Trembath, the attending physician, personally viewed and interpreted this ECG.  Date: 08/10/2015 EKG Time: 21:09 Rate: 77 Rhythm: normal sinus rhythm with first-degree AV block QRS Axis: normal Intervals: normal ST/T Wave abnormalities: normal Conduction Disutrbances: none Narrative Interpretation: unremarkable   The patient was seen in flex and had a CT scan performed due to his fall and head trauma.  His head CT demonstrated that he had a subdural hematoma and a slight skull fracture.  The patient was sent over to the main side, but the mid-level provider who cared for him handled the transfer to [facility] for his traumatic subdural I evaluated the patient personally when he came to the main side of the emergency department.  He has no complaints at this time and has no neurological deficits with good strength and weakness in all 4 of his extremities.  His pupils are equal and responsive and he complains of no visual symptoms at this time.  I evaluated him immediately prior to EMS transport.  Hinda Kehr, MD 08/10/15 [phone]

## 2015-08-10 NOTE — ED Notes (Addendum)
Pt to triage via wheelchair.  Pt fell off a ladder cutting tree limbs and landed on concrete. Pt fell approx 3 feet.   No neck or back pain.  No loc.  No vomiting.  Laceration to back of head.  Bleeding controlled.  Pt alert .  Speech clear

## 2015-12-21 DIAGNOSIS — E890 Postprocedural hypothyroidism: Secondary | ICD-10-CM | POA: Diagnosis not present

## 2015-12-21 DIAGNOSIS — M1612 Unilateral primary osteoarthritis, left hip: Secondary | ICD-10-CM | POA: Diagnosis not present

## 2015-12-21 DIAGNOSIS — I10 Essential (primary) hypertension: Secondary | ICD-10-CM | POA: Diagnosis not present

## 2015-12-21 DIAGNOSIS — K219 Gastro-esophageal reflux disease without esophagitis: Secondary | ICD-10-CM | POA: Diagnosis not present

## 2015-12-21 DIAGNOSIS — J4520 Mild intermittent asthma, uncomplicated: Secondary | ICD-10-CM | POA: Diagnosis not present

## 2015-12-21 DIAGNOSIS — Z0000 Encounter for general adult medical examination without abnormal findings: Secondary | ICD-10-CM | POA: Diagnosis not present

## 2015-12-29 DIAGNOSIS — J309 Allergic rhinitis, unspecified: Secondary | ICD-10-CM | POA: Diagnosis not present

## 2015-12-29 DIAGNOSIS — D696 Thrombocytopenia, unspecified: Secondary | ICD-10-CM | POA: Diagnosis not present

## 2015-12-29 DIAGNOSIS — E890 Postprocedural hypothyroidism: Secondary | ICD-10-CM | POA: Diagnosis not present

## 2015-12-29 DIAGNOSIS — J4520 Mild intermittent asthma, uncomplicated: Secondary | ICD-10-CM | POA: Diagnosis not present

## 2016-02-09 DIAGNOSIS — Z96642 Presence of left artificial hip joint: Secondary | ICD-10-CM | POA: Diagnosis not present

## 2016-02-28 DIAGNOSIS — Z85828 Personal history of other malignant neoplasm of skin: Secondary | ICD-10-CM | POA: Diagnosis not present

## 2016-02-28 DIAGNOSIS — Z1283 Encounter for screening for malignant neoplasm of skin: Secondary | ICD-10-CM | POA: Diagnosis not present

## 2016-02-28 DIAGNOSIS — D485 Neoplasm of uncertain behavior of skin: Secondary | ICD-10-CM | POA: Diagnosis not present

## 2016-02-28 DIAGNOSIS — L821 Other seborrheic keratosis: Secondary | ICD-10-CM | POA: Diagnosis not present

## 2016-02-28 DIAGNOSIS — L300 Nummular dermatitis: Secondary | ICD-10-CM | POA: Diagnosis not present

## 2016-02-28 DIAGNOSIS — L812 Freckles: Secondary | ICD-10-CM | POA: Diagnosis not present

## 2016-02-28 DIAGNOSIS — D692 Other nonthrombocytopenic purpura: Secondary | ICD-10-CM | POA: Diagnosis not present

## 2016-02-28 DIAGNOSIS — L853 Xerosis cutis: Secondary | ICD-10-CM | POA: Diagnosis not present

## 2016-02-28 DIAGNOSIS — L578 Other skin changes due to chronic exposure to nonionizing radiation: Secondary | ICD-10-CM | POA: Diagnosis not present

## 2016-02-28 DIAGNOSIS — D1800 Hemangioma unspecified site: Secondary | ICD-10-CM | POA: Diagnosis not present

## 2016-03-16 DIAGNOSIS — J4541 Moderate persistent asthma with (acute) exacerbation: Secondary | ICD-10-CM | POA: Diagnosis not present

## 2016-03-16 DIAGNOSIS — J309 Allergic rhinitis, unspecified: Secondary | ICD-10-CM | POA: Diagnosis not present

## 2016-03-16 DIAGNOSIS — E890 Postprocedural hypothyroidism: Secondary | ICD-10-CM | POA: Diagnosis not present

## 2016-03-16 DIAGNOSIS — K219 Gastro-esophageal reflux disease without esophagitis: Secondary | ICD-10-CM | POA: Diagnosis not present

## 2016-04-25 DIAGNOSIS — D696 Thrombocytopenia, unspecified: Secondary | ICD-10-CM | POA: Diagnosis not present

## 2016-04-25 DIAGNOSIS — K219 Gastro-esophageal reflux disease without esophagitis: Secondary | ICD-10-CM | POA: Diagnosis not present

## 2016-04-25 DIAGNOSIS — J309 Allergic rhinitis, unspecified: Secondary | ICD-10-CM | POA: Diagnosis not present

## 2016-04-25 DIAGNOSIS — M1612 Unilateral primary osteoarthritis, left hip: Secondary | ICD-10-CM | POA: Diagnosis not present

## 2016-04-25 DIAGNOSIS — J440 Chronic obstructive pulmonary disease with acute lower respiratory infection: Secondary | ICD-10-CM | POA: Diagnosis not present

## 2016-06-11 ENCOUNTER — Encounter: Payer: Self-pay | Admitting: Emergency Medicine

## 2016-06-11 ENCOUNTER — Emergency Department
Admission: EM | Admit: 2016-06-11 | Discharge: 2016-06-11 | Disposition: A | Discharge status: Home / Self Care | Payer: PPO | Attending: Emergency Medicine | Admitting: Emergency Medicine

## 2016-06-11 ENCOUNTER — Emergency Department: Payer: PPO

## 2016-06-11 DIAGNOSIS — Z7982 Long term (current) use of aspirin: Secondary | ICD-10-CM | POA: Diagnosis not present

## 2016-06-11 DIAGNOSIS — S52592A Other fractures of lower end of left radius, initial encounter for closed fracture: Secondary | ICD-10-CM | POA: Diagnosis not present

## 2016-06-11 DIAGNOSIS — Y92007 Garden or yard of unspecified non-institutional (private) residence as the place of occurrence of the external cause: Secondary | ICD-10-CM | POA: Diagnosis not present

## 2016-06-11 DIAGNOSIS — J449 Chronic obstructive pulmonary disease, unspecified: Secondary | ICD-10-CM | POA: Diagnosis not present

## 2016-06-11 DIAGNOSIS — J45909 Unspecified asthma, uncomplicated: Secondary | ICD-10-CM | POA: Diagnosis not present

## 2016-06-11 DIAGNOSIS — W1839XA Other fall on same level, initial encounter: Secondary | ICD-10-CM | POA: Diagnosis not present

## 2016-06-11 DIAGNOSIS — Y998 Other external cause status: Secondary | ICD-10-CM | POA: Diagnosis not present

## 2016-06-11 DIAGNOSIS — Z87891 Personal history of nicotine dependence: Secondary | ICD-10-CM | POA: Diagnosis not present

## 2016-06-11 DIAGNOSIS — Y9389 Activity, other specified: Secondary | ICD-10-CM | POA: Diagnosis not present

## 2016-06-11 DIAGNOSIS — S52502A Unspecified fracture of the lower end of left radius, initial encounter for closed fracture: Secondary | ICD-10-CM | POA: Diagnosis not present

## 2016-06-11 DIAGNOSIS — M25532 Pain in left wrist: Secondary | ICD-10-CM | POA: Diagnosis not present

## 2016-06-11 DIAGNOSIS — S52612A Displaced fracture of left ulna styloid process, initial encounter for closed fracture: Secondary | ICD-10-CM

## 2016-06-11 DIAGNOSIS — Z79899 Other long term (current) drug therapy: Secondary | ICD-10-CM | POA: Diagnosis not present

## 2016-06-11 DIAGNOSIS — S52509A Unspecified fracture of the lower end of unspecified radius, initial encounter for closed fracture: Secondary | ICD-10-CM

## 2016-06-11 MED ORDER — LIDOCAINE HCL (PF) 1 % IJ SOLN
2.0000 mL | Freq: Once | INTRAMUSCULAR | Status: AC
  Administered 2016-06-11: 2 mL

## 2016-06-11 MED ORDER — BUPIVACAINE HCL (PF) 0.5 % IJ SOLN
5.0000 mL | Freq: Once | INTRAMUSCULAR | Status: AC
  Administered 2016-06-11: 5 mL
  Filled 2016-06-11: qty 30

## 2016-06-11 MED ORDER — ACETAMINOPHEN 500 MG PO TABS
1000.0000 mg | ORAL_TABLET | Freq: Once | ORAL | Status: AC
  Administered 2016-06-11: 1000 mg via ORAL
  Filled 2016-06-11: qty 2

## 2016-06-11 MED ORDER — LIDOCAINE HCL (PF) 1 % IJ SOLN
INTRAMUSCULAR | Status: AC
  Administered 2016-06-11: 2 mL
  Filled 2016-06-11: qty 5

## 2016-06-11 MED ORDER — TRAMADOL HCL 50 MG PO TABS
50.0000 mg | ORAL_TABLET | Freq: Four times a day (QID) | ORAL | Status: DC | PRN
Start: 2016-06-11 — End: 2017-08-07

## 2016-06-11 NOTE — ED Provider Notes (Signed)
CSN: [id number]     Arrival date & time 06/11/16  1842 History   First MD Initiated Contact with Patient 06/11/16 1903     Chief Complaint  Patient presents with  . Fall     (Consider location/radiation/quality/duration/timing/severity/associated sxs/prior Treatment) HPI  82 year old male presents to the emergency department for evaluation of left wrist pain. Patient's right hand dominant, he states 30 minutes prior to arrival he was working on [place] in his yard, he stepped on some debris in his yard, lost his balance and fell onto left outstretched hand. He denies any other injury to his body, there was no head injury or loss of consciousness. No neck or back pain. Patient was able to ambulate at the time and is currently ambulating with no assisted devices. Wife state he is acting normal with no signs of confusion. His pain is moderate to the left wrist with a deformity. He admits to some abrasions to the elbow and hand but no pain to these areas. He has not had any medications for pain. He takes 325 mg daily. No numbness or tingling to the left upper extremity.   Past Medical History  Diagnosis Date  . COPD (chronic obstructive pulmonary disease) ([place])   . Asthma    History reviewed. No pertinent past surgical history. No family history on file. Social History  Substance Use Topics  . Smoking status: Former Smoker    Types: Cigarettes  . Smokeless tobacco: Never Used  . Alcohol Use: No    Review of Systems  Constitutional: Negative.  Negative for fever, chills, activity change and appetite change.  HENT: Negative for congestion, ear pain, mouth sores, rhinorrhea, sinus pressure, sore throat and trouble swallowing.   Eyes: Negative for photophobia, pain and discharge.  Respiratory: Negative for cough, chest tightness and shortness of breath.   Cardiovascular: Negative for chest pain and leg swelling.  Gastrointestinal: Negative for nausea, vomiting, abdominal pain, diarrhea  and abdominal distention.  Genitourinary: Negative for dysuria and difficulty urinating.  Musculoskeletal: Positive for joint swelling and arthralgias. Negative for back pain and gait problem.  Skin: Negative for color change and rash.  Neurological: Negative for dizziness and headaches.  Hematological: Negative for adenopathy.  Psychiatric/Behavioral: Negative for behavioral problems and agitation.      Allergies  Review of patient's allergies indicates no known allergies.  Home Medications   Prior to Admission medications   Medication Sig Start Date End Date Taking? Authorizing Provider  aspirin 325 MG tablet Take 325 mg by mouth daily.    Historical Provider, MD  fexofenadine (ALLEGRA) 180 MG tablet Take 180 mg by mouth daily.    Historical Provider, MD  traMADol (ULTRAM) 50 MG tablet Take 1 tablet (50 mg total) by mouth every 6 (six) hours as needed. 06/11/16   Duanne Guess, PA-C   BP 132/80 mmHg  Pulse 69  Temp(Src) 98.7 F (37.1 C) (Oral)  Resp 16  Ht 6' (1.829 m)  Wt 75.751 kg  BMI 22.64 kg/m2  SpO2 97% Physical Exam  Constitutional: He is oriented to person, place, and time. He appears well-developed and well-nourished.  HENT:  Head: Normocephalic and atraumatic.  Eyes: Conjunctivae and EOM are normal. Pupils are equal, round, and reactive to light.  Neck: Normal range of motion. Neck supple.  Cardiovascular: Normal rate, regular rhythm, normal heart sounds and intact distal pulses.   Pulmonary/Chest: Effort normal and breath sounds normal. No respiratory distress. He has no wheezes. He has no rales. He exhibits  no tenderness.  Abdominal: Soft. Bowel sounds are normal. He exhibits no distension. There is no tenderness.  Musculoskeletal:  Examination of the cervical thoracic and lumbar spine shows patient has no spinous process tenderness palpation. He has full range of motion of the cervical spine with no pain. He has full range of motion of the hips knees and ankles  with no discomfort. Patient's left wrist has a ulnar positive deformity with superficial abrasions, no sign of open fracture. He is able make a fist and sensation is intact distally. He has 2+ radial pulse 2+ cap refill. He is tender to palpation along the distal radial metaphysis.  Neurological: He is alert and oriented to person, place, and time.  Skin: Skin is warm and dry.  Psychiatric: He has a normal mood and affect. His behavior is normal. Judgment and thought content normal.    ED Course  Procedures (including critical care time) Hematoma block: Dorsal aspect of the left wrist was prepped with alcohol and injected with a 25 one and a half inch needle. A bolus solution of 5 cc of 1% lidocaine and 5 cc of 0.5% Marcaine was injected along the fracture was confirmed in the hematoma with aspiration and flash of blood in the syringe. Patient tolerated procedure well, Band-Aid was applied. Patient had immediate relief of pain.   Closed reduction left distal radius fracture:  After pain relief from hematoma block, patient's hand was placed into finger traps and traction was applied to the mid humerus. A superior and trouble or force was applied to the distal radius. Reduction was achieved and cast padding was applied followed by cast plaster. Patient tolerated procedure well. After plaster sugar tong splint was applied patient without numbness or tingling in 2+ cap refill.  SPLINT APPLICATION Date/Time: [id number] PM Authorized by: Feliberto Gottron Consent: Verbal consent obtained. Risks and benefits: risks, benefits and alternatives were discussed Consent given by: patient Splint applied by: Physician Asst., ED tech Location details: Left sugar tong left arm  Splint type: Sugar tong plaster  Supplies used: Plaster, Ace wraps, prewrap  Post-procedure: The splinted body part was neurovascularly unchanged following the procedure. Patient tolerance: Patient tolerated the procedure well with  no immediate complications.     Labs Review Labs Reviewed - No data to display  Imaging Review Dg Wrist Complete Left  06/11/2016  CLINICAL DATA:  Post reduction wrist fracture EXAM: LEFT WRIST - COMPLETE 3+ VIEW COMPARISON:  Earlier same day FINDINGS: Three views through a plaster cast show reduction of the distal radio all fracture with restoration of a neutral tilt of the distal radial articular surface. No worsening or complicating feature. IMPRESSION: Improved alignment post reduction with neutral tilt of the distal radial articular surface. Electronically Signed   By: Nelson Chimes M.D.   On: 06/11/2016 21:17   Dg Wrist Complete Left  06/11/2016  CLINICAL DATA:  Left wrist pain and left arm pain after fall. EXAM: LEFT WRIST - COMPLETE 3+ VIEW COMPARISON:  None FINDINGS: There is a acute, intra-articular impacted fracture deformity involving the distal radius. There is mild dorsal angulation of the distal fracture fragments. Comminuted ulnar styloid fracture is also noted. IMPRESSION: 1. Acute impacted fracture involves the distal radius with dorsal angulation of the distal fracture fragments. 2. Ulnar styloid fracture. Electronically Signed   By: Kerby Moors M.D.   On: 06/11/2016 19:05   I have personally reviewed and evaluated these images and lab results as part of my medical decision-making.  EKG Interpretation None      MDM   Final diagnoses:  Displaced fracture of distal end of radius  Fracture of ulnar styloid, left, closed, initial encounter    82 year old male with left closed impacted displaced comminuted distal radial metaphysis fracture and ulnar styloid fracture. Patient neurovascularly intact. Patient underwent a hematoma block and closed reduction with plaster splint application. Postreduction x-ray show significantly improved alignment. She will follow-up with orthopedics in 2 days. He is placed into a sling. At discharge patient was neurovascularly intact with 2+  cap refill. He is educated on signs and symptoms return to the emergency department for. Tylenol and/or tramadol as needed for pain.    Duanne Guess, PA-C 06/11/16 2127  Duanne Guess, PA-C 06/11/16 2257  Orbie Pyo, MD 06/12/16 [phone]

## 2016-06-11 NOTE — Discharge Instructions (Signed)
Cast or Splint Care Casts and splints support injured limbs and keep bones from moving while they heal.  HOME CARE  Keep the cast or splint uncovered during the drying period.  A plaster cast can take 24 to 48 hours to dry.  A fiberglass cast will dry in less than 1 hour.  Do not rest the cast on anything harder than a pillow for 24 hours.  Do not put weight on your injured limb. Do not put pressure on the cast. Wait for your doctor's approval.  Keep the cast or splint dry.  Cover the cast or splint with a plastic bag during baths or wet weather.  If you have a cast over your chest and belly (trunk), take sponge baths until the cast is taken off.  If your cast gets wet, dry it with a towel or blow dryer. Use the cool setting on the blow dryer.  Keep your cast or splint clean. Wash a dirty cast with a damp cloth.  Do not put any objects under your cast or splint.  Do not scratch the skin under the cast with an object. If itching is a problem, use a blow dryer on a cool setting over the itchy area.  Do not trim or cut your cast.  Do not take out the padding from inside your cast.  Exercise your joints near the cast as told by your doctor.  Raise (elevate) your injured limb on 1 or 2 pillows for the first 1 to 3 days. GET HELP IF:  Your cast or splint cracks.  Your cast or splint is too tight or too loose.  You itch badly under the cast.  Your cast gets wet or has a soft spot.  You have a bad smell coming from the cast.  You get an object stuck under the cast.  Your skin around the cast becomes red or sore.  You have new or more pain after the cast is put on. GET HELP RIGHT AWAY IF:  You have fluid leaking through the cast.  You cannot move your fingers or toes.  Your fingers or toes turn blue or white or are cool, painful, or puffy (swollen).  You have tingling or lose feeling (numbness) around the injured area.  You have bad pain or pressure under the  cast.  You have trouble breathing or have shortness of breath.  You have chest pain.   This information is not intended to replace advice given to you by your health care provider. Make sure you discuss any questions you have with your health care provider.   Document Released: 03/28/2011 Document Revised: 07/29/2013 Document Reviewed: 06/04/2013 Elsevier Interactive Patient Education 2016 [employer]  Radial Fracture A radial fracture is a break in the radius bone, which is the long bone of the forearm that is on the same side as your thumb. Your forearm is the part of your arm that is between your elbow and your wrist. It is made up of two bones: the radius and the ulna. Most radial fractures occur near the wrist (distal radialfracture) or near the elbow (radial head fracture). A distal radial fracture is the most common type of broken arm. This fracture usually occurs about an inch above the wrist. Fractures of the middle part of the bone are less common. CAUSES  Falling with your arm outstretched is the most common cause of a radial fracture. Other causes include:  Car accidents.  Bike accidents.  A direct blow to  the middle part of the radius. RISK FACTORS  You may be at greater risk for a distal radial fracture if you are 18 years of age or older.  You may be at greater risk for a radial head fracture if you are:  Male.  50-32 years old.  You may be at a greater risk for all types of radial fractures if you have a condition that causes your bones to be weak or thin (osteoporosis). SIGNS AND SYMPTOMS A radial fracture causes pain immediately after the injury. Other signs and symptoms include:  An abnormal bend or bump in your arm (deformity).  Swelling.  Bruising.  Numbness or tingling.  Tenderness.  Limited movement. DIAGNOSIS  Your health care provider may diagnose a radial fracture based on:  Your symptoms.  Your medical history, including any recent  injury.  A physical exam. Your health care provider will look for any deformity and feel for tenderness over the break. Your health care provider will also check whether the bone is out of place.  An X-ray exam to confirm the diagnosis and learn more about the type of fracture. TREATMENT The goals of treatment are to get the bone in proper position for healing and to keep it from moving so it will heal over time. Your treatment will depend on many factors, especially the type of fracture that you have.  If the fractured bone:  Is in the correct position (nondisplaced), you may only need to wear a cast or a splint.  Has a slightly displaced fracture, you may need to have the bones moved back into place manually (closed reduction) before the splint or cast is put on.  You may have a temporary splint before you have a plaster cast. The splint allows room for some swelling. After a few days, a cast can replace the splint.  You may have to wear the cast for about 6 weeks or as directed by your health care provider.  The cast may be changed after about 3 weeks or as directed by your health care provider.  After your cast is taken off, you may need physical therapy to regain full movement in your wrist or elbow.  You may need emergency surgery if you have:  A fractured bone that is out of position (displaced).  A fracture with multiple fragments (comminuted fracture).  A fracture that breaks the skin (open fracture). This type of fracture may require surgical wires, plates, or screws to hold the bone in place.  You may have X-rays every couple of weeks to check on your healing. HOME CARE INSTRUCTIONS  Keep the injured arm above the level of your heart while you are sitting or lying down. This helps to reduce swelling and pain.  Apply ice to the injured area:  Put ice in a plastic bag.  Place a towel between your skin and the bag.  Leave the ice on for 20 minutes, 2-3 times per  day.  Move your fingers often to avoid stiffness and to minimize swelling.  If you have a plaster or fiberglass cast:  Do not try to scratch the skin under the cast using sharp or pointed objects.  Check the skin around the cast every day. You may put lotion on any red or sore areas.  Keep your cast dry and clean.  If you have a plaster splint:  Wear the splint as directed.  Loosen the elastic around the splint if your fingers become numb and tingle, or if  they turn cold and blue.  Do not put pressure on any part of your cast until it is fully hardened. Rest your cast only on a pillow for the first 24 hours.  Protect your cast or splint while bathing or showering, as directed by your health care provider. Do not put your cast or splint into water.  Take medicines only as directed by your health care provider.  Return to activities, such as sports, as directed by your health care provider. Ask your health care provider what activities are safe for you.  Keep all follow-up visits as directed by your health care provider. This is important. SEEK MEDICAL CARE IF:  Your pain medicine is not helping.  Your cast gets damaged or it breaks.  Your cast becomes loose.  Your cast gets wet.  You have more severe pain or swelling than you did before the cast.  You have severe pain when stretching your fingers.  You continue to have pain or stiffness in your elbow or your wrist after your cast is taken off. SEEK IMMEDIATE MEDICAL CARE IF:  You cannot move your fingers.  You lose feeling in your fingers or your hand.  Your hand or your fingers turn cold and pale or blue.  You notice a bad smell coming from your cast.  You have drainage from underneath your cast.  You have new stains from blood or drainage seeping through your cast.   This information is not intended to replace advice given to you by your health care provider. Make sure you discuss any questions you have with  your health care provider.   Document Released: 05/09/2006 Document Revised: 12/17/2014 Document Reviewed: 05/21/2014 Elsevier Interactive Patient Education 2016 [employer]  Ulnar Fracture An ulnar fracture is a break in the ulna bone, which is the forearm bone that is located on the same side as your little finger. Your forearm is the part of your arm that is between your elbow and your wrist. It is made up of two bones: the radius and ulna. The ulna forms the point of your elbow at its upper end. The lower end can be felt on the outside of your wrist. An ulnar fracture can happen near the wrist or elbow or in the middle of your forearm. Middle forearm fractures usually break both the radius and the ulna. CAUSES A heavy, direct blow to the forearm is the most common cause of an ulnar fracture. It takes a lot of force to break a bone in your forearm. This type of injury may be caused by:  An accident, such as a car or bike accident.  Falling with your arm outstretched. RISK FACTORS You may be at greater risk for an ulnar fracture if you:  Play contact sports.  Have a condition that causes your bones to be weak or thin (osteoporosis). SIGNS AND SYMPTOMS  An ulnar fracture causes pain immediately after the injury. You may need to support your forearm with your other hand. Other signs and symptoms include:  An abnormal bend or bump in your arm (deformity).  Swelling.  Bruising.  Numbness or weakness in your hand.  Inability to turn your hand from side to side (rotate). DIAGNOSIS Your health care provider may diagnose an ulnar fracture based on:  Your symptoms.  Your medical history, including any recent injury.  A physical exam. Your health care provider will look for any deformity and feel for tenderness over the break. Your health care provider will also  check whether the bone is out of place.  An X-ray exam to confirm the diagnosis and learn more about the type of  fracture. TREATMENT The goals of treatment are to get the bone in proper position for healing and to keep it from moving so it will heal over time. Your treatment will depend on many factors, especially the type of fracture that you have.  If the fractured bone:  Is in the correct position (nondisplaced), you may only need to wear a cast or a splint.  Has a slightly displaced fracture, you may need to have the bones moved back into place manually (closed reduction) before the splint or cast is put on.  You may have a temporary splint before you have a plaster cast. The splint allows room for some swelling. After a few days, a cast can replace the splint.  You may have to wear the cast for about 6 weeks or as directed by your health care provider.  The cast may be changed after about 3 weeks or as directed by your health care provider.  After your cast is taken off, you may need physical therapy to regain full movement in your wrist or elbow.  You may need emergency surgery if you have:  A fractured bone that is out of position (displaced).  A fracture with multiple fragments (comminuted fracture).  A fracture that breaks the skin (open fracture). This type of fracture may require surgical wires, plates, or screws to hold the bone in place.  You may have X-rays every couple of weeks to check on your healing. HOME CARE INSTRUCTIONS  Keep the injured arm above the level of your heart while you are sitting or lying down. This helps to reduce swelling and pain.  Apply ice to the injured area:  Put ice in a plastic bag.  Place a towel between your skin and the bag.  Leave the ice on for 20 minutes, 2-3 times per day.  Move your fingers often to avoid stiffness and to minimize swelling.  If you have a plaster or fiberglass cast:  Do not try to scratch the skin under the cast using sharp or pointed objects.  Check the skin around the cast every day. You may put lotion on any red  or sore areas.  Keep your cast dry and clean.  If you have a plaster splint:  Wear the splint as directed.  Loosen the elastic around the splint if your fingers become numb and tingle, or if they turn cold and blue.  Do not put pressure on any part of your cast until it is fully hardened. Rest your cast only on a pillow for the first 24 hours.  Protect your cast or splint while bathing or showering, as directed by your health care provider. Do not put your cast or splint into water.  Take medicines only as directed by your health care provider.  Return to activities, such as sports, as directed by your health care provider. Ask your health care provider what activities are safe for you.  Keep all follow-up visits as directed by your health care provider. This is important. SEEK MEDICAL CARE IF:  Your pain medicine is not helping.  Your cast gets damaged or it breaks.  Your cast becomes loose.  Your cast gets wet.  You have more severe pain or swelling than you did before the cast.  You have severe pain when stretching your fingers.  You continue to have pain or  stiffness in your elbow or your wrist after your cast is taken off. SEEK IMMEDIATE MEDICAL CARE IF:  You cannot move your fingers.  You lose feeling in your fingers or your hand.  Your hand or your fingers turn cold and pale or blue.  You notice a bad smell coming from your cast.  You have drainage from underneath your cast.  You have new stains from blood or drainage seeping through your cast.   This information is not intended to replace advice given to you by your health care provider. Make sure you discuss any questions you have with your health care provider.   Document Released: 05/09/2006 Document Revised: 12/17/2014 Document Reviewed: 05/05/2014 Elsevier Interactive Patient Education 2016 [place].   Please wear sling, elevate the upper extremity as much as possible. Take Tylenol as needed for  mild to moderate pain tramadol as needed for severe pain. Please follow-up with Dr. Elyse Jarvis office in 2 days. Return to the ER for any numbness tingling or severe pain. Please keep splint clean and dry.

## 2016-06-11 NOTE — ED Notes (Signed)
Patient presents to the ED with left wrist pain and left arm pain post fall about 30 min pta.  Patient states he was pulling up bushes and lost his balance.  Patient has a small skin tear on his left arm.  Patient's left wrist appears deformed at this time.  Patient ambulatory to triage.  Patient denies hitting his head or passing out.  Denies other injury.

## 2016-06-14 DIAGNOSIS — S52532A Colles' fracture of left radius, initial encounter for closed fracture: Secondary | ICD-10-CM | POA: Diagnosis not present

## 2016-06-14 DIAGNOSIS — S52612A Displaced fracture of left ulna styloid process, initial encounter for closed fracture: Secondary | ICD-10-CM | POA: Diagnosis not present

## 2016-06-22 DIAGNOSIS — S52532D Colles' fracture of left radius, subsequent encounter for closed fracture with routine healing: Secondary | ICD-10-CM | POA: Diagnosis not present

## 2016-06-22 DIAGNOSIS — S52612D Displaced fracture of left ulna styloid process, subsequent encounter for closed fracture with routine healing: Secondary | ICD-10-CM | POA: Diagnosis not present

## 2016-06-26 DIAGNOSIS — E890 Postprocedural hypothyroidism: Secondary | ICD-10-CM | POA: Diagnosis not present

## 2016-06-26 DIAGNOSIS — J309 Allergic rhinitis, unspecified: Secondary | ICD-10-CM | POA: Diagnosis not present

## 2016-06-26 DIAGNOSIS — J4520 Mild intermittent asthma, uncomplicated: Secondary | ICD-10-CM | POA: Diagnosis not present

## 2016-07-03 DIAGNOSIS — Z23 Encounter for immunization: Secondary | ICD-10-CM | POA: Diagnosis not present

## 2016-07-03 DIAGNOSIS — J4520 Mild intermittent asthma, uncomplicated: Secondary | ICD-10-CM | POA: Diagnosis not present

## 2016-07-03 DIAGNOSIS — E538 Deficiency of other specified B group vitamins: Secondary | ICD-10-CM | POA: Diagnosis not present

## 2016-07-03 DIAGNOSIS — Z96642 Presence of left artificial hip joint: Secondary | ICD-10-CM | POA: Diagnosis not present

## 2016-07-03 DIAGNOSIS — J309 Allergic rhinitis, unspecified: Secondary | ICD-10-CM | POA: Diagnosis not present

## 2016-07-03 DIAGNOSIS — Z0000 Encounter for general adult medical examination without abnormal findings: Secondary | ICD-10-CM | POA: Diagnosis not present

## 2016-07-06 DIAGNOSIS — S52532D Colles' fracture of left radius, subsequent encounter for closed fracture with routine healing: Secondary | ICD-10-CM | POA: Diagnosis not present

## 2016-07-27 DIAGNOSIS — S52532D Colles' fracture of left radius, subsequent encounter for closed fracture with routine healing: Secondary | ICD-10-CM | POA: Diagnosis not present

## 2016-08-27 DIAGNOSIS — Z23 Encounter for immunization: Secondary | ICD-10-CM | POA: Diagnosis not present

## 2016-09-05 DIAGNOSIS — H35372 Puckering of macula, left eye: Secondary | ICD-10-CM | POA: Diagnosis not present

## 2016-10-01 DIAGNOSIS — L300 Nummular dermatitis: Secondary | ICD-10-CM | POA: Diagnosis not present

## 2016-10-01 DIAGNOSIS — L821 Other seborrheic keratosis: Secondary | ICD-10-CM | POA: Diagnosis not present

## 2016-10-01 DIAGNOSIS — D1800 Hemangioma unspecified site: Secondary | ICD-10-CM | POA: Diagnosis not present

## 2016-12-25 DIAGNOSIS — Z0000 Encounter for general adult medical examination without abnormal findings: Secondary | ICD-10-CM | POA: Diagnosis not present

## 2016-12-25 DIAGNOSIS — Z125 Encounter for screening for malignant neoplasm of prostate: Secondary | ICD-10-CM | POA: Diagnosis not present

## 2016-12-25 DIAGNOSIS — J309 Allergic rhinitis, unspecified: Secondary | ICD-10-CM | POA: Diagnosis not present

## 2016-12-25 DIAGNOSIS — Z96642 Presence of left artificial hip joint: Secondary | ICD-10-CM | POA: Diagnosis not present

## 2016-12-25 DIAGNOSIS — J4520 Mild intermittent asthma, uncomplicated: Secondary | ICD-10-CM | POA: Diagnosis not present

## 2017-01-01 DIAGNOSIS — D696 Thrombocytopenia, unspecified: Secondary | ICD-10-CM | POA: Diagnosis not present

## 2017-01-01 DIAGNOSIS — E890 Postprocedural hypothyroidism: Secondary | ICD-10-CM | POA: Diagnosis not present

## 2017-01-01 DIAGNOSIS — J4520 Mild intermittent asthma, uncomplicated: Secondary | ICD-10-CM | POA: Diagnosis not present

## 2017-01-01 DIAGNOSIS — M1612 Unilateral primary osteoarthritis, left hip: Secondary | ICD-10-CM | POA: Diagnosis not present

## 2017-01-01 DIAGNOSIS — K219 Gastro-esophageal reflux disease without esophagitis: Secondary | ICD-10-CM | POA: Diagnosis not present

## 2017-01-01 DIAGNOSIS — Z0000 Encounter for general adult medical examination without abnormal findings: Secondary | ICD-10-CM | POA: Diagnosis not present

## 2017-01-01 DIAGNOSIS — N183 Chronic kidney disease, stage 3 (moderate): Secondary | ICD-10-CM | POA: Diagnosis not present

## 2017-04-03 DIAGNOSIS — L820 Inflamed seborrheic keratosis: Secondary | ICD-10-CM | POA: Diagnosis not present

## 2017-04-03 DIAGNOSIS — L578 Other skin changes due to chronic exposure to nonionizing radiation: Secondary | ICD-10-CM | POA: Diagnosis not present

## 2017-04-03 DIAGNOSIS — Z85828 Personal history of other malignant neoplasm of skin: Secondary | ICD-10-CM | POA: Diagnosis not present

## 2017-04-03 DIAGNOSIS — L812 Freckles: Secondary | ICD-10-CM | POA: Diagnosis not present

## 2017-04-03 DIAGNOSIS — L570 Actinic keratosis: Secondary | ICD-10-CM | POA: Diagnosis not present

## 2017-04-03 DIAGNOSIS — L821 Other seborrheic keratosis: Secondary | ICD-10-CM | POA: Diagnosis not present

## 2017-04-11 IMAGING — CT CT HEAD W/O CM
2 series · 13 of 30 positions shown, 15 images · non-contrast
Comparison: Brain MRI May 04, 2009

CLINICAL DATA: Pain following fall

EXAM:
CT HEAD WITHOUT CONTRAST
TECHNIQUE: Contiguous axial images were obtained from the base of the skull
through the vertex without intravenous contrast.

[Series 2: head wo · axial · 0.41mm/px · z∈[-69,+21]mm · 5 of 31 slices shown, 7 images]
[im 6/31  brain]
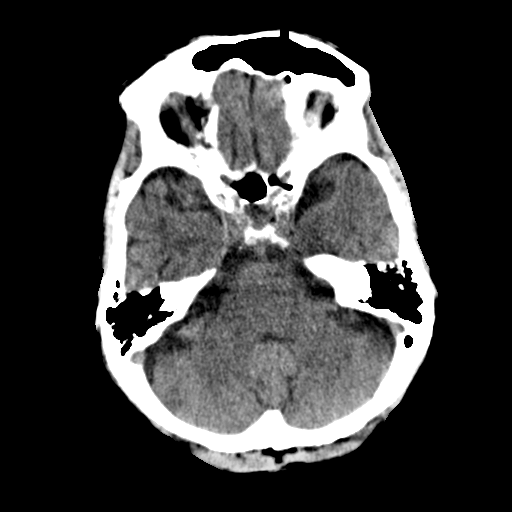
[im 6/31  bone]
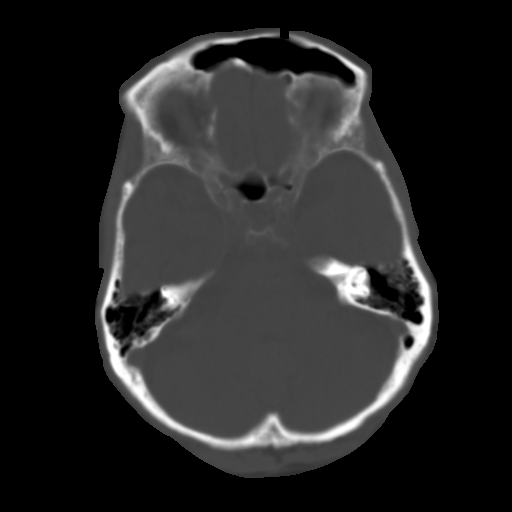
[im 11/31  brain]
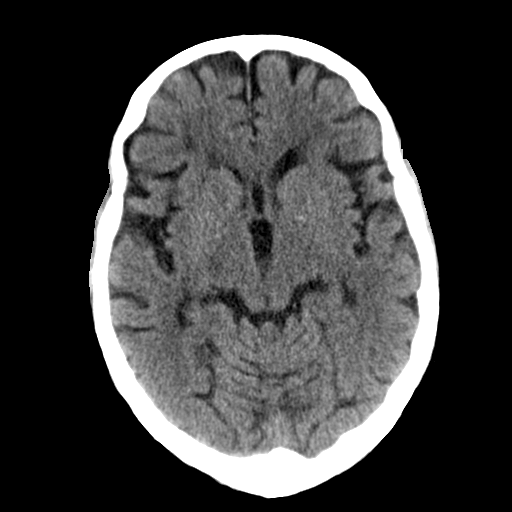
[im 16/31  brain]
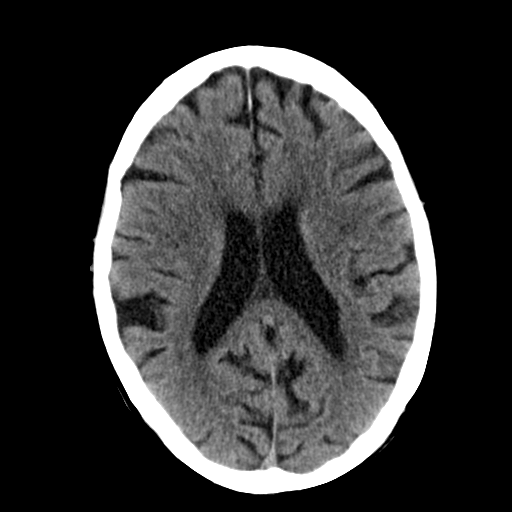
[im 21/31  brain]
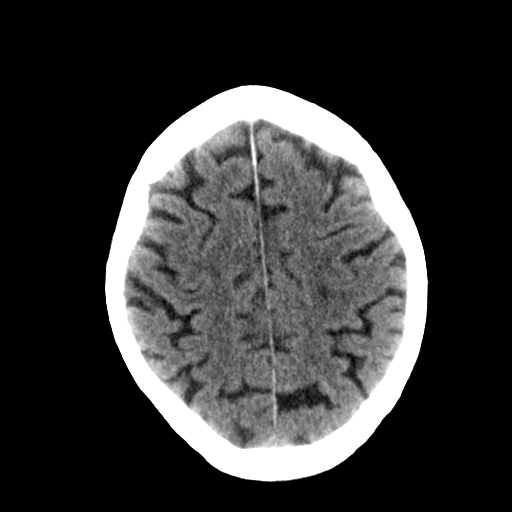
[im 26/31  brain]
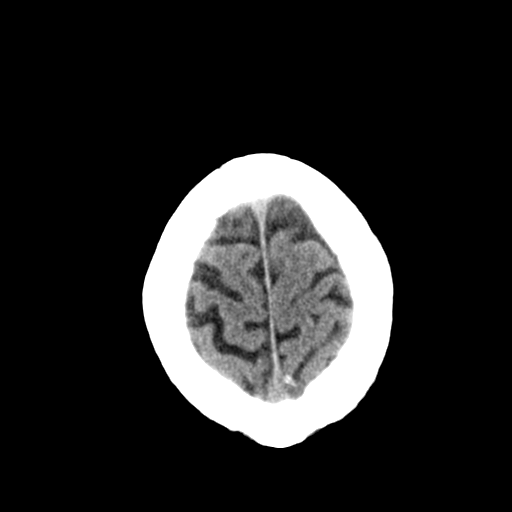
[im 26/31  bone]
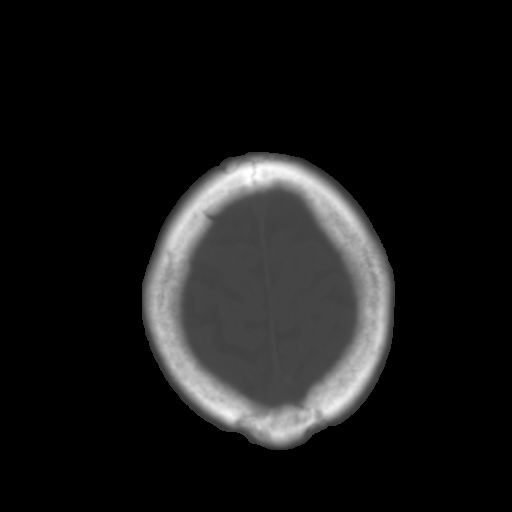

[Series 3: head bone · axial · 0.41mm/px · z∈[-81,+36]mm · 8 of 96 slices shown]
[im 9/96  bone]
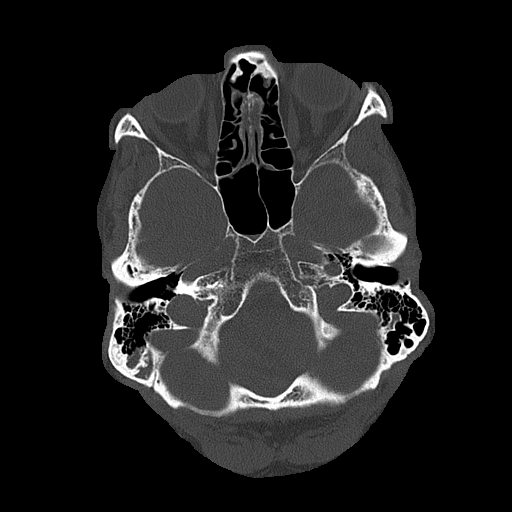
[im 18/96  bone]
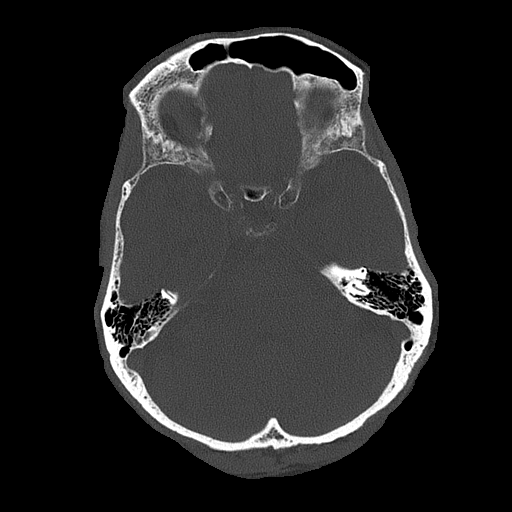
[im 31/96  bone]
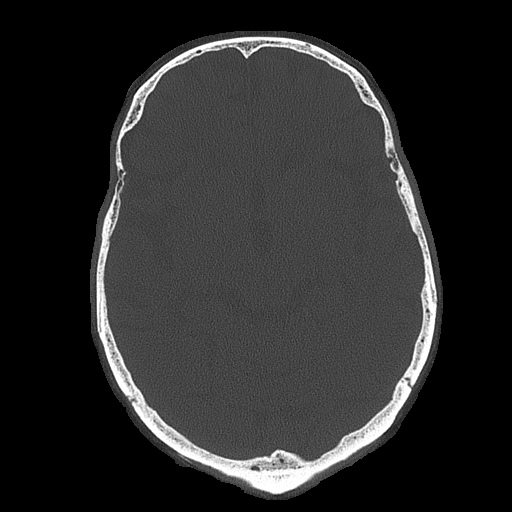
[im 44/96  bone]
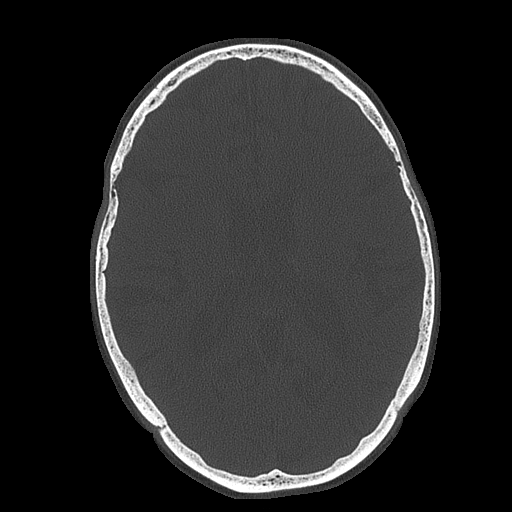
[im 52/96  bone]
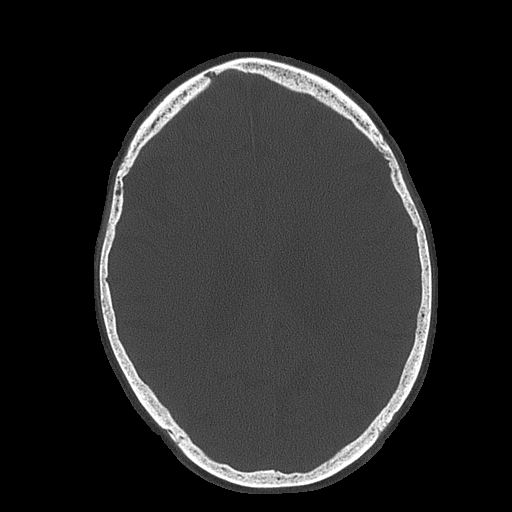
[im 65/96  bone]
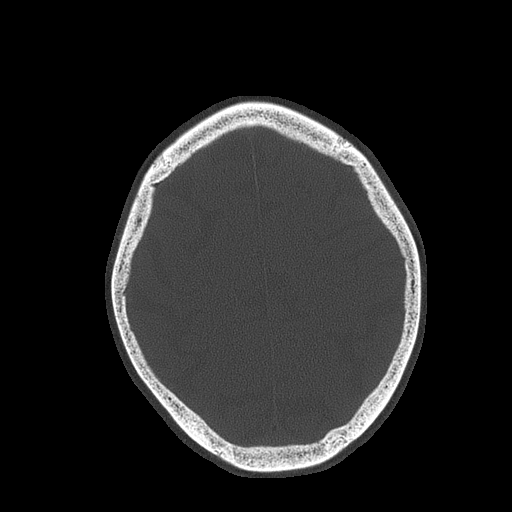
[im 78/96  bone]
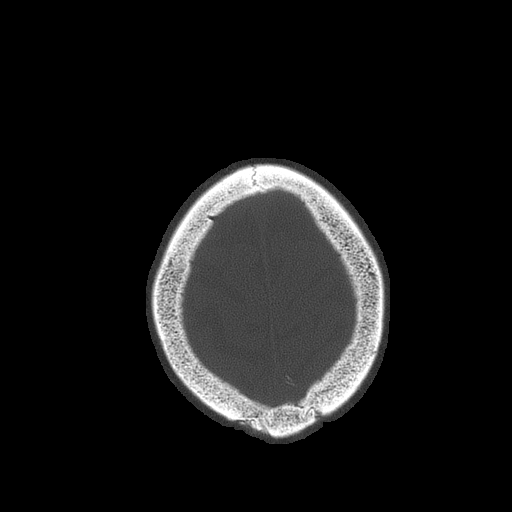
[im 87/96  bone]
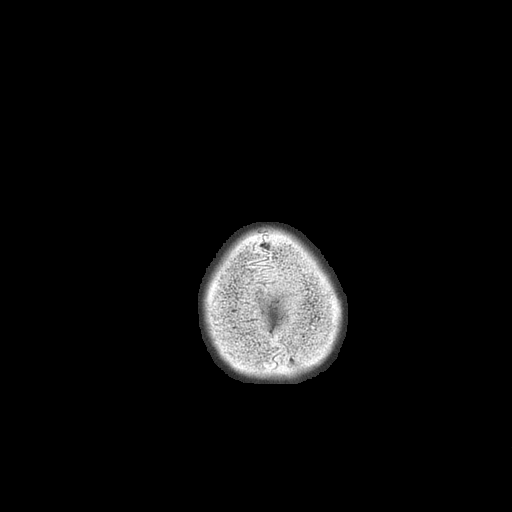

[13 of 30 positions shown; findings below may reference images not displayed]

FINDINGS: Mild diffuse atrophy is stable. There is no intracranial mass,
hemorrhage, extra-axial fluid collection, or midline shift. There is
patchy small vessel disease in the centra semiovale bilaterally.
Elsewhere gray-white compartments appear normal. No demonstrable
acute infarct.

The bony calvarium appears intact. There is opacification of several
inferior mastoid air cells. Most of the mastoids are clear. There is
mild mucosal thickening in several ethmoid air cells bilaterally.
IMPRESSION: Mild atrophy with patchy periventricular small vessel disease. No
intracranial mass, hemorrhage, or extra-axial fluid collection. No
acute infarct apparent. Mild ethmoid sinus disease. Opacification of
several inferior mastoid air cells without air-fluid levels.

## 2017-05-08 DIAGNOSIS — J4520 Mild intermittent asthma, uncomplicated: Secondary | ICD-10-CM | POA: Diagnosis not present

## 2017-05-08 DIAGNOSIS — K219 Gastro-esophageal reflux disease without esophagitis: Secondary | ICD-10-CM | POA: Diagnosis not present

## 2017-05-08 DIAGNOSIS — L03116 Cellulitis of left lower limb: Secondary | ICD-10-CM | POA: Diagnosis not present

## 2017-05-08 DIAGNOSIS — S80862A Insect bite (nonvenomous), left lower leg, initial encounter: Secondary | ICD-10-CM | POA: Diagnosis not present

## 2017-05-08 DIAGNOSIS — W57XXXA Bitten or stung by nonvenomous insect and other nonvenomous arthropods, initial encounter: Secondary | ICD-10-CM | POA: Diagnosis not present

## 2017-06-03 DIAGNOSIS — H2512 Age-related nuclear cataract, left eye: Secondary | ICD-10-CM | POA: Diagnosis not present

## 2017-06-20 DIAGNOSIS — H2512 Age-related nuclear cataract, left eye: Secondary | ICD-10-CM | POA: Diagnosis not present

## 2017-06-25 ENCOUNTER — Encounter: Payer: Self-pay | Admitting: *Deleted

## 2017-06-26 DIAGNOSIS — I8311 Varicose veins of right lower extremity with inflammation: Secondary | ICD-10-CM | POA: Diagnosis not present

## 2017-06-26 DIAGNOSIS — S70361A Insect bite (nonvenomous), right thigh, initial encounter: Secondary | ICD-10-CM | POA: Diagnosis not present

## 2017-06-26 DIAGNOSIS — L609 Nail disorder, unspecified: Secondary | ICD-10-CM | POA: Diagnosis not present

## 2017-06-27 DIAGNOSIS — Z0000 Encounter for general adult medical examination without abnormal findings: Secondary | ICD-10-CM | POA: Diagnosis not present

## 2017-06-27 DIAGNOSIS — E890 Postprocedural hypothyroidism: Secondary | ICD-10-CM | POA: Diagnosis not present

## 2017-06-27 DIAGNOSIS — K219 Gastro-esophageal reflux disease without esophagitis: Secondary | ICD-10-CM | POA: Diagnosis not present

## 2017-06-27 DIAGNOSIS — J4520 Mild intermittent asthma, uncomplicated: Secondary | ICD-10-CM | POA: Diagnosis not present

## 2017-06-27 DIAGNOSIS — M1612 Unilateral primary osteoarthritis, left hip: Secondary | ICD-10-CM | POA: Diagnosis not present

## 2017-06-27 DIAGNOSIS — N183 Chronic kidney disease, stage 3 (moderate): Secondary | ICD-10-CM | POA: Diagnosis not present

## 2017-06-27 DIAGNOSIS — D696 Thrombocytopenia, unspecified: Secondary | ICD-10-CM | POA: Diagnosis not present

## 2017-07-02 ENCOUNTER — Ambulatory Visit: Admission: RE | Admit: 2017-07-02 | Payer: PPO | Source: Ambulatory Visit | Admitting: Ophthalmology

## 2017-07-02 ENCOUNTER — Encounter: Admission: RE | Payer: Self-pay | Source: Ambulatory Visit

## 2017-07-02 HISTORY — DX: Cerebral infarction, unspecified: I63.9

## 2017-07-02 HISTORY — DX: Gout, unspecified: M10.9

## 2017-07-02 HISTORY — DX: Gastro-esophageal reflux disease without esophagitis: K21.9

## 2017-07-02 HISTORY — DX: Hypothyroidism, unspecified: E03.9

## 2017-07-02 HISTORY — DX: Sleep apnea, unspecified: G47.30

## 2017-07-02 HISTORY — DX: Personal history of urinary calculi: Z87.442

## 2017-07-02 SURGERY — PHACOEMULSIFICATION, CATARACT, WITH IOL INSERTION
Anesthesia: Choice | Laterality: Left

## 2017-07-04 DIAGNOSIS — Z96642 Presence of left artificial hip joint: Secondary | ICD-10-CM | POA: Diagnosis not present

## 2017-07-04 DIAGNOSIS — K219 Gastro-esophageal reflux disease without esophagitis: Secondary | ICD-10-CM | POA: Diagnosis not present

## 2017-07-04 DIAGNOSIS — Z23 Encounter for immunization: Secondary | ICD-10-CM | POA: Diagnosis not present

## 2017-07-04 DIAGNOSIS — D696 Thrombocytopenia, unspecified: Secondary | ICD-10-CM | POA: Diagnosis not present

## 2017-07-04 DIAGNOSIS — T7840XS Allergy, unspecified, sequela: Secondary | ICD-10-CM | POA: Diagnosis not present

## 2017-07-04 DIAGNOSIS — E890 Postprocedural hypothyroidism: Secondary | ICD-10-CM | POA: Diagnosis not present

## 2017-08-09 ENCOUNTER — Encounter: Payer: Self-pay | Admitting: *Deleted

## 2017-08-13 ENCOUNTER — Encounter
Admission: RE | Disposition: A | Discharge status: Home / Self Care | Payer: Self-pay | Source: Ambulatory Visit | Attending: Ophthalmology

## 2017-08-13 ENCOUNTER — Ambulatory Visit
Admission: RE | Admit: 2017-08-13 | Discharge: 2017-08-13 | Disposition: A | Discharge status: Home / Self Care | Payer: PPO | Source: Ambulatory Visit | Attending: Ophthalmology | Admitting: Ophthalmology

## 2017-08-13 ENCOUNTER — Ambulatory Visit: Payer: PPO | Admitting: Anesthesiology

## 2017-08-13 DIAGNOSIS — H2512 Age-related nuclear cataract, left eye: Secondary | ICD-10-CM | POA: Diagnosis not present

## 2017-08-13 DIAGNOSIS — K219 Gastro-esophageal reflux disease without esophagitis: Secondary | ICD-10-CM | POA: Diagnosis not present

## 2017-08-13 DIAGNOSIS — E039 Hypothyroidism, unspecified: Secondary | ICD-10-CM | POA: Insufficient documentation

## 2017-08-13 DIAGNOSIS — Z96653 Presence of artificial knee joint, bilateral: Secondary | ICD-10-CM | POA: Insufficient documentation

## 2017-08-13 DIAGNOSIS — Z8673 Personal history of transient ischemic attack (TIA), and cerebral infarction without residual deficits: Secondary | ICD-10-CM | POA: Insufficient documentation

## 2017-08-13 DIAGNOSIS — Z87891 Personal history of nicotine dependence: Secondary | ICD-10-CM | POA: Diagnosis not present

## 2017-08-13 DIAGNOSIS — J449 Chronic obstructive pulmonary disease, unspecified: Secondary | ICD-10-CM | POA: Insufficient documentation

## 2017-08-13 DIAGNOSIS — Z96642 Presence of left artificial hip joint: Secondary | ICD-10-CM | POA: Insufficient documentation

## 2017-08-13 DIAGNOSIS — M109 Gout, unspecified: Secondary | ICD-10-CM | POA: Insufficient documentation

## 2017-08-13 DIAGNOSIS — Z87442 Personal history of urinary calculi: Secondary | ICD-10-CM | POA: Insufficient documentation

## 2017-08-13 DIAGNOSIS — G4730 Sleep apnea, unspecified: Secondary | ICD-10-CM | POA: Insufficient documentation

## 2017-08-13 DIAGNOSIS — I639 Cerebral infarction, unspecified: Secondary | ICD-10-CM | POA: Diagnosis not present

## 2017-08-13 HISTORY — PX: CATARACT EXTRACTION W/PHACO: SHX586

## 2017-08-13 HISTORY — DX: Spotted fever due to Rickettsia rickettsii: A77.0

## 2017-08-13 LAB — GLUCOSE, CAPILLARY: GLUCOSE-CAPILLARY: 93 mg/dL (ref 65–99)

## 2017-08-13 SURGERY — PHACOEMULSIFICATION, CATARACT, WITH IOL INSERTION
Anesthesia: Monitor Anesthesia Care | Site: Eye | Laterality: Left | Wound class: Clean

## 2017-08-13 MED ORDER — EPINEPHRINE PF 1 MG/ML IJ SOLN
INTRAOCULAR | Status: DC | PRN
  Administered 2017-08-13: 10:00:00 via OPHTHALMIC

## 2017-08-13 MED ORDER — EPINEPHRINE PF 1 MG/ML IJ SOLN
INTRAMUSCULAR | Status: AC
  Filled 2017-08-13: qty 2

## 2017-08-13 MED ORDER — CARBACHOL 0.01 % IO SOLN
INTRAOCULAR | Status: DC | PRN
  Administered 2017-08-13: 0.5 mL via INTRAOCULAR

## 2017-08-13 MED ORDER — SODIUM CHLORIDE 0.9 % IV SOLN: INTRAVENOUS | Status: DC

## 2017-08-13 MED ORDER — NA CHONDROIT SULF-NA HYALURON 40-17 MG/ML IO SOLN
INTRAOCULAR | Status: AC
  Filled 2017-08-13: qty 1

## 2017-08-13 MED ORDER — LIDOCAINE HCL (PF) 4 % IJ SOLN
INTRAOCULAR | Status: DC | PRN
  Administered 2017-08-13: 4 mL via OPHTHALMIC

## 2017-08-13 MED ORDER — POVIDONE-IODINE 5 % OP SOLN
OPHTHALMIC | Status: DC | PRN
  Administered 2017-08-13: 1 via OPHTHALMIC

## 2017-08-13 MED ORDER — FENTANYL CITRATE (PF) 100 MCG/2ML IJ SOLN
INTRAMUSCULAR | Status: DC | PRN
  Administered 2017-08-13: 50 ug via INTRAVENOUS

## 2017-08-13 MED ORDER — MOXIFLOXACIN HCL 0.5 % OP SOLN
OPHTHALMIC | Status: AC
  Filled 2017-08-13: qty 3

## 2017-08-13 MED ORDER — MOXIFLOXACIN HCL 0.5 % OP SOLN: 1.0000 [drp] | Freq: Once | OPHTHALMIC | Status: DC

## 2017-08-13 MED ORDER — FENTANYL CITRATE (PF) 100 MCG/2ML IJ SOLN
INTRAMUSCULAR | Status: AC
  Filled 2017-08-13: qty 2

## 2017-08-13 MED ORDER — ARMC OPHTHALMIC DILATING DROPS
OPHTHALMIC | Status: AC
  Administered 2017-08-13: 1 via OPHTHALMIC
  Filled 2017-08-13: qty 0.4

## 2017-08-13 MED ORDER — LIDOCAINE HCL (PF) 4 % IJ SOLN
INTRAMUSCULAR | Status: AC
  Filled 2017-08-13: qty 5

## 2017-08-13 MED ORDER — POVIDONE-IODINE 5 % OP SOLN
OPHTHALMIC | Status: AC
  Filled 2017-08-13: qty 30

## 2017-08-13 MED ORDER — MOXIFLOXACIN HCL 0.5 % OP SOLN
OPHTHALMIC | Status: DC | PRN
  Administered 2017-08-13: 0.2 mL via OPHTHALMIC

## 2017-08-13 MED ORDER — ARMC OPHTHALMIC DILATING DROPS
1.0000 "application " | OPHTHALMIC | Status: AC
  Administered 2017-08-13 (×3): 1 via OPHTHALMIC

## 2017-08-13 MED ORDER — NA CHONDROIT SULF-NA HYALURON 40-17 MG/ML IO SOLN
INTRAOCULAR | Status: DC | PRN
  Administered 2017-08-13: 1 mL via INTRAOCULAR

## 2017-08-13 MED ORDER — SODIUM CHLORIDE 0.9 % IV SOLN
INTRAVENOUS | Status: DC
  Administered 2017-08-13: 08:00:00 via INTRAVENOUS

## 2017-08-13 SURGICAL SUPPLY — 16 items
GLOVE BIO SURGEON STRL SZ8 (GLOVE) ×3 IMPLANT
GLOVE BIOGEL M 6.5 STRL (GLOVE) ×3 IMPLANT
GLOVE SURG LX 8.0 MICRO (GLOVE) ×2
GLOVE SURG LX STRL 8.0 MICRO (GLOVE) ×1 IMPLANT
GOWN STRL REUS W/ TWL LRG LVL3 (GOWN DISPOSABLE) ×2 IMPLANT
GOWN STRL REUS W/TWL LRG LVL3 (GOWN DISPOSABLE) ×4
LABEL CATARACT MEDS ST (LABEL) ×3 IMPLANT
LENS IOL TECNIS ITEC 23.0 (Intraocular Lens) ×3 IMPLANT
PACK CATARACT (MISCELLANEOUS) ×3 IMPLANT
PACK CATARACT BRASINGTON LX (MISCELLANEOUS) ×3 IMPLANT
PACK EYE AFTER SURG (MISCELLANEOUS) ×3 IMPLANT
SOL BSS BAG (MISCELLANEOUS) ×3
SOLUTION BSS BAG (MISCELLANEOUS) ×1 IMPLANT
SYR 5ML LL (SYRINGE) ×3 IMPLANT
WATER STERILE IRR 250ML POUR (IV SOLUTION) ×3 IMPLANT
WIPE NON LINTING 3.25X3.25 (MISCELLANEOUS) ×3 IMPLANT

## 2017-08-13 NOTE — Anesthesia Postprocedure Evaluation (Signed)
Anesthesia Post Note  Patient: Richard Fry  Procedure(s) Performed: Procedure(s) (LRB): CATARACT EXTRACTION PHACO AND INTRAOCULAR LENS PLACEMENT (IOC) (Left)  Patient location during evaluation: Short Stay Anesthesia Type: MAC Level of consciousness: awake and alert and oriented Pain management: pain level controlled Vital Signs Assessment: post-procedure vital signs reviewed and stable Respiratory status: respiratory function stable and spontaneous breathing Cardiovascular status: stable Postop Assessment: no headache and adequate PO intake Anesthetic complications: no     Last Vitals:  Vitals:   08/13/17 1021 08/13/17 1022  BP: (!) 143/57 (!) 143/57  Pulse: (!) 58 (!) 58  Resp: 16 16  Temp: 36.5 C   SpO2: 99% 99%    Last Pain:  Vitals:   08/13/17 1021  TempSrc: Temporal                 Lanora Manis

## 2017-08-13 NOTE — Op Note (Signed)
PREOPERATIVE DIAGNOSIS:  Nuclear sclerotic cataract of the left eye.   POSTOPERATIVE DIAGNOSIS:  Nuclear sclerotic cataract of the left eye.   OPERATIVE PROCEDURE: Procedure(s): CATARACT EXTRACTION PHACO AND INTRAOCULAR LENS PLACEMENT (IOC)   SURGEON:  Birder Robson, MD.   ANESTHESIA:  Anesthesiologist: Gunnar Bulla, MD CRNA: Jonna Clark, CRNA  1.      Managed anesthesia care. 2.     0.73ml of Shugarcaine was instilled following the paracentesis   COMPLICATIONS:  None.   TECHNIQUE:   Stop and chop   DESCRIPTION OF PROCEDURE:  The patient was examined and consented in the preoperative holding area where the aforementioned topical anesthesia was applied to the left eye and then brought back to the Operating Room where the left eye was prepped and draped in the usual sterile ophthalmic fashion and a lid speculum was placed. A paracentesis was created with the side port blade and the anterior chamber was filled with viscoelastic. A near clear corneal incision was performed with the steel keratome. A continuous curvilinear capsulorrhexis was performed with a cystotome followed by the capsulorrhexis forceps. Hydrodissection and hydrodelineation were carried out with BSS on a blunt cannula. The lens was removed in a stop and chop  technique and the remaining cortical material was removed with the irrigation-aspiration handpiece. The capsular bag was inflated with viscoelastic and the Technis [id number] lens was placed in the capsular bag without complication. The remaining viscoelastic was removed from the eye with the irrigation-aspiration handpiece. The wounds were hydrated. The anterior chamber was flushed with Miostat and the eye was inflated to physiologic pressure. 0.31ml Vigamox was placed in the anterior chamber. The wounds were found to be water tight. The eye was dressed with Vigamox. The patient was given protective glasses to wear throughout the day and a shield with which to sleep  tonight. The patient was also given drops with which to begin a drop regimen today and will follow-up with me in one day.  Implant Name Type Inv. Item Serial No. Manufacturer Lot No. LRB No. Used  LENS IOL DIOP 23.0 - [id number] Intraocular Lens LENS IOL DIOP 23.0 [id number] AMO   Left 1    Procedure(s) with comments: CATARACT EXTRACTION PHACO AND INTRAOCULAR LENS PLACEMENT (IOC) (Left) - [place] 00:42.8 AP% 13.1 CDE 5.60 FLUID PACK LOT # [id number]  Electronically signed: [place] 08/13/2017 10:20 AM

## 2017-08-13 NOTE — H&P (Signed)
All labs reviewed. Abnormal studies sent to patients PCP when indicated.  Previous H&P reviewed, patient examined, there are NO CHANGES.  Richard Fry LOUIS9/4/20189:20 AM

## 2017-08-13 NOTE — Discharge Instructions (Signed)
FOLLOW DR. PORFILIO'S POSTOP DISCHARGE INSTRUCTIONS AS REVIEWED.  Eye Surgery Discharge Instructions  Expect mild scratchy sensation or mild soreness. DO NOT RUB YOUR EYE!  The day of surgery:  Minimal physical activity, but bed rest is not required  No reading, computer work, or close hand work  No bending, lifting, or straining.  May watch TV  For 24 hours:  No driving, legal decisions, or alcoholic beverages  Safety precautions  Eat anything you prefer: It is better to start with liquids, then soup then solid foods.  _____ Eye patch should be worn until postoperative exam tomorrow.  ____ Solar shield eyeglasses should be worn for comfort in the sunlight/patch while sleeping  Resume all regular medications including aspirin or Coumadin if these were discontinued prior to surgery. You may shower, bathe, shave, or wash your hair. Tylenol may be taken for mild discomfort.  Call your doctor if you experience significant pain, nausea, or vomiting, fever > 101 or other signs of infection. [phone] or [phone] Specific instructions:  Follow-up Information    Birder Robson, MD Follow up.   Specialty:  Ophthalmology Why:  08/14/17 @ 8:55 am Contact information: [place] [address] [phone]

## 2017-08-13 NOTE — Transfer of Care (Signed)
Immediate Anesthesia Transfer of Care Note  Patient: Richard Fry  Procedure(s) Performed: Procedure(s) with comments: CATARACT EXTRACTION PHACO AND INTRAOCULAR LENS PLACEMENT (IOC) (Left) - [place] 00:42.8 AP% 13.1 CDE 5.60 FLUID PACK LOT # [id number]  Patient Location: PACU and Short Stay  Anesthesia Type:MAC  Level of Consciousness: awake, alert  and oriented  Airway & Oxygen Therapy: Patient Spontanous Breathing  Post-op Assessment: Report given to RN and Post -op Vital signs reviewed and stable  Post vital signs: Reviewed and stable  Last Vitals:  Vitals:   08/13/17 0744 08/13/17 1022  BP: (!) 150/58 (!) 143/57  Pulse: 61 (!) 58  Resp: 16 16  Temp: 36.6 C   SpO2: 98% 99%    Last Pain:  Vitals:   08/13/17 0744  TempSrc: Oral         Complications: No apparent anesthesia complications

## 2017-08-13 NOTE — Anesthesia Post-op Follow-up Note (Signed)
Anesthesia QCDR form completed.        

## 2017-08-13 NOTE — Anesthesia Preprocedure Evaluation (Addendum)
Anesthesia Evaluation  Patient identified by MRN, date of birth, ID band Patient awake    Reviewed: Allergy & Precautions, NPO status , Patient's Chart, lab work & pertinent test results, reviewed documented beta blocker date and time   Airway Mallampati: II  TM Distance: >3 FB     Dental  (+) Chipped, Upper Dentures, Lower Dentures   Pulmonary asthma , sleep apnea , COPD, former smoker,           Cardiovascular      Neuro/Psych CVA    GI/Hepatic GERD  Controlled,  Endo/Other  Hypothyroidism   Renal/GU      Musculoskeletal   Abdominal   Peds  Hematology   Anesthesia Other Findings Gout.[place] sp fever. Does not use CPAP.  Reproductive/Obstetrics                            Anesthesia Physical Anesthesia Plan  ASA: III  Anesthesia Plan: MAC   Post-op Pain Management:    Induction:   PONV Risk Score and Plan:   Airway Management Planned:   Additional Equipment:   Intra-op Plan:   Post-operative Plan:   Informed Consent: I have reviewed the patients History and Physical, chart, labs and discussed the procedure including the risks, benefits and alternatives for the proposed anesthesia with the patient or authorized representative who has indicated his/her understanding and acceptance.     Plan Discussed with: CRNA  Anesthesia Plan Comments:         Anesthesia Quick Evaluation

## 2017-08-14 ENCOUNTER — Encounter: Payer: Self-pay | Admitting: Ophthalmology

## 2017-09-11 DIAGNOSIS — H2511 Age-related nuclear cataract, right eye: Secondary | ICD-10-CM | POA: Diagnosis not present

## 2017-09-12 ENCOUNTER — Encounter: Payer: Self-pay | Admitting: *Deleted

## 2017-09-24 ENCOUNTER — Ambulatory Visit: Payer: PPO | Admitting: Anesthesiology

## 2017-09-24 ENCOUNTER — Encounter: Payer: Self-pay | Admitting: Emergency Medicine

## 2017-09-24 ENCOUNTER — Ambulatory Visit
Admission: RE | Admit: 2017-09-24 | Discharge: 2017-09-24 | Disposition: A | Discharge status: Home / Self Care | Payer: PPO | Source: Ambulatory Visit | Attending: Ophthalmology | Admitting: Ophthalmology

## 2017-09-24 ENCOUNTER — Encounter
Admission: RE | Disposition: A | Discharge status: Home / Self Care | Payer: Self-pay | Source: Ambulatory Visit | Attending: Ophthalmology

## 2017-09-24 DIAGNOSIS — H2511 Age-related nuclear cataract, right eye: Secondary | ICD-10-CM | POA: Insufficient documentation

## 2017-09-24 DIAGNOSIS — J449 Chronic obstructive pulmonary disease, unspecified: Secondary | ICD-10-CM | POA: Diagnosis not present

## 2017-09-24 DIAGNOSIS — Z8673 Personal history of transient ischemic attack (TIA), and cerebral infarction without residual deficits: Secondary | ICD-10-CM | POA: Insufficient documentation

## 2017-09-24 DIAGNOSIS — Z87891 Personal history of nicotine dependence: Secondary | ICD-10-CM | POA: Diagnosis not present

## 2017-09-24 DIAGNOSIS — Z79899 Other long term (current) drug therapy: Secondary | ICD-10-CM | POA: Diagnosis not present

## 2017-09-24 DIAGNOSIS — G4730 Sleep apnea, unspecified: Secondary | ICD-10-CM | POA: Diagnosis not present

## 2017-09-24 DIAGNOSIS — K219 Gastro-esophageal reflux disease without esophagitis: Secondary | ICD-10-CM | POA: Diagnosis not present

## 2017-09-24 DIAGNOSIS — J45909 Unspecified asthma, uncomplicated: Secondary | ICD-10-CM | POA: Diagnosis not present

## 2017-09-24 DIAGNOSIS — E039 Hypothyroidism, unspecified: Secondary | ICD-10-CM | POA: Insufficient documentation

## 2017-09-24 HISTORY — PX: CATARACT EXTRACTION W/PHACO: SHX586

## 2017-09-24 SURGERY — PHACOEMULSIFICATION, CATARACT, WITH IOL INSERTION
Anesthesia: Monitor Anesthesia Care | Site: Eye | Laterality: Right | Wound class: Clean

## 2017-09-24 MED ORDER — MOXIFLOXACIN HCL 0.5 % OP SOLN
1.0000 [drp] | OPHTHALMIC | Status: DC | PRN
Start: 2017-09-24 — End: 2017-09-24

## 2017-09-24 MED ORDER — ALFENTANIL 500 MCG/ML IJ INJ
INJECTION | INTRAVENOUS | Status: DC | PRN
  Administered 2017-09-24 (×2): 500 ug via INTRAVENOUS

## 2017-09-24 MED ORDER — BSS IO SOLN
INTRAOCULAR | Status: DC | PRN
  Administered 2017-09-24: 1 mL via OPHTHALMIC

## 2017-09-24 MED ORDER — MOXIFLOXACIN HCL 0.5 % OP SOLN
OPHTHALMIC | Status: AC
  Filled 2017-09-24: qty 3

## 2017-09-24 MED ORDER — EPINEPHRINE PF 1 MG/ML IJ SOLN
INTRAMUSCULAR | Status: AC
  Filled 2017-09-24: qty 1

## 2017-09-24 MED ORDER — ARMC OPHTHALMIC DILATING DROPS
1.0000 "application " | OPHTHALMIC | Status: AC
  Administered 2017-09-24 (×3): 1 via OPHTHALMIC

## 2017-09-24 MED ORDER — SODIUM CHLORIDE 0.9 % IV SOLN
INTRAVENOUS | Status: DC
  Administered 2017-09-24 (×2): via INTRAVENOUS

## 2017-09-24 MED ORDER — LIDOCAINE HCL (PF) 4 % IJ SOLN
INTRAOCULAR | Status: DC | PRN
  Administered 2017-09-24: 2 mL via OPHTHALMIC

## 2017-09-24 MED ORDER — LIDOCAINE HCL (PF) 4 % IJ SOLN
INTRAMUSCULAR | Status: AC
  Filled 2017-09-24: qty 5

## 2017-09-24 MED ORDER — MOXIFLOXACIN HCL 0.5 % OP SOLN
OPHTHALMIC | Status: DC | PRN
  Administered 2017-09-24: .2 mL via OPHTHALMIC

## 2017-09-24 MED ORDER — POVIDONE-IODINE 5 % OP SOLN
OPHTHALMIC | Status: DC | PRN
  Administered 2017-09-24: 1 via OPHTHALMIC

## 2017-09-24 MED ORDER — ARMC OPHTHALMIC DILATING DROPS
OPHTHALMIC | Status: AC
  Administered 2017-09-24: 1 via OPHTHALMIC
  Filled 2017-09-24: qty 0.4

## 2017-09-24 MED ORDER — NA CHONDROIT SULF-NA HYALURON 40-17 MG/ML IO SOLN
INTRAOCULAR | Status: DC | PRN
  Administered 2017-09-24: 1 mL via INTRAOCULAR

## 2017-09-24 MED ORDER — CARBACHOL 0.01 % IO SOLN
INTRAOCULAR | Status: DC | PRN
  Administered 2017-09-24: .5 mL via INTRAOCULAR

## 2017-09-24 MED ORDER — POVIDONE-IODINE 5 % OP SOLN
OPHTHALMIC | Status: AC
  Filled 2017-09-24: qty 30

## 2017-09-24 MED ORDER — NA CHONDROIT SULF-NA HYALURON 40-17 MG/ML IO SOLN
INTRAOCULAR | Status: AC
  Filled 2017-09-24: qty 1

## 2017-09-24 SURGICAL SUPPLY — 16 items
GLOVE BIO SURGEON STRL SZ8 (GLOVE) ×3 IMPLANT
GLOVE BIOGEL M 6.5 STRL (GLOVE) ×3 IMPLANT
GLOVE SURG LX 8.0 MICRO (GLOVE) ×2
GLOVE SURG LX STRL 8.0 MICRO (GLOVE) ×1 IMPLANT
GOWN STRL REUS W/ TWL LRG LVL3 (GOWN DISPOSABLE) ×2 IMPLANT
GOWN STRL REUS W/TWL LRG LVL3 (GOWN DISPOSABLE) ×4
LABEL CATARACT MEDS ST (LABEL) ×3 IMPLANT
LENS IOL TECNIS ITEC 23.0 (Intraocular Lens) ×3 IMPLANT
PACK CATARACT (MISCELLANEOUS) ×3 IMPLANT
PACK CATARACT BRASINGTON LX (MISCELLANEOUS) ×3 IMPLANT
PACK EYE AFTER SURG (MISCELLANEOUS) ×3 IMPLANT
SOL BSS BAG (MISCELLANEOUS) ×3
SOLUTION BSS BAG (MISCELLANEOUS) ×1 IMPLANT
SYR 5ML LL (SYRINGE) ×3 IMPLANT
WATER STERILE IRR 250ML POUR (IV SOLUTION) ×3 IMPLANT
WIPE NON LINTING 3.25X3.25 (MISCELLANEOUS) ×3 IMPLANT

## 2017-09-24 NOTE — Discharge Instructions (Signed)
Eye Surgery Discharge Instructions  Expect mild scratchy sensation or mild soreness. DO NOT RUB YOUR EYE!  The day of surgery:  Minimal physical activity, but bed rest is not required  No reading, computer work, or close hand work  No bending, lifting, or straining.  May watch TV  For 24 hours:  No driving, legal decisions, or alcoholic beverages  Safety precautions  Eat anything you prefer: It is better to start with liquids, then soup then solid foods.  _____ Eye patch should be worn until postoperative exam tomorrow.  ____ Solar shield eyeglasses should be worn for comfort in the sunlight/patch while sleeping  Resume all regular medications including aspirin or Coumadin if these were discontinued prior to surgery. You may shower, bathe, shave, or wash your hair. Tylenol may be taken for mild discomfort.  Call your doctor if you experience significant pain, nausea, or vomiting, fever > 101 or other signs of infection. [phone] or [phone] Specific instructions:  Follow-up Information    Birder Robson, MD Follow up on 09/25/2017.   Specialty:  Ophthalmology Why:  patient will call Contact information: [place] [address] [phone]

## 2017-09-24 NOTE — Anesthesia Postprocedure Evaluation (Signed)
Anesthesia Post Note  Patient: Richard Fry  Procedure(s) Performed: CATARACT EXTRACTION PHACO AND INTRAOCULAR LENS PLACEMENT (IOC) (Right Eye)  Patient location during evaluation: PACU Anesthesia Type: MAC Level of consciousness: awake and alert Pain management: pain level controlled Vital Signs Assessment: post-procedure vital signs reviewed and stable Respiratory status: spontaneous breathing, nonlabored ventilation and respiratory function stable Cardiovascular status: stable and blood pressure returned to baseline Postop Assessment: no apparent nausea or vomiting Anesthetic complications: no     Last Vitals:  Vitals:   09/24/17 0941 09/24/17 1140  BP: (!) 152/74 131/63  Pulse: 65   Resp: 15   Temp: 36.5 C 36.5 C  SpO2: 100% 99%    Last Pain:  Vitals:   09/24/17 0941  TempSrc: Oral                 Silvana Newness A

## 2017-09-24 NOTE — H&P (Signed)
All labs reviewed. Abnormal studies sent to patients PCP when indicated.  Previous H&P reviewed, patient examined, there are NO CHANGES.  Richard Fry LOUIS10/16/201811:11 AM

## 2017-09-24 NOTE — Anesthesia Preprocedure Evaluation (Addendum)
Anesthesia Evaluation  Patient identified by MRN, date of birth, ID band Patient awake    Reviewed: Allergy & Precautions, NPO status , Patient's Chart, lab work & pertinent test results, reviewed documented beta blocker date and time   Airway Mallampati: II  TM Distance: >3 FB     Dental  (+) Upper Dentures, Lower Dentures   Pulmonary asthma , sleep apnea , COPD, former smoker,           Cardiovascular      Neuro/Psych TIA   GI/Hepatic GERD  Controlled,  Endo/Other  Hypothyroidism   Renal/GU      Musculoskeletal   Abdominal   Peds  Hematology   Anesthesia Other Findings   Reproductive/Obstetrics                            Anesthesia Physical Anesthesia Plan  ASA: III  Anesthesia Plan: MAC   Post-op Pain Management:    Induction:   PONV Risk Score and Plan:   Airway Management Planned:   Additional Equipment:   Intra-op Plan:   Post-operative Plan:   Informed Consent: I have reviewed the patients History and Physical, chart, labs and discussed the procedure including the risks, benefits and alternatives for the proposed anesthesia with the patient or authorized representative who has indicated his/her understanding and acceptance.     Plan Discussed with: CRNA  Anesthesia Plan Comments:         Anesthesia Quick Evaluation

## 2017-09-24 NOTE — Anesthesia Post-op Follow-up Note (Signed)
Anesthesia QCDR form completed.        

## 2017-09-24 NOTE — Op Note (Signed)
PREOPERATIVE DIAGNOSIS:  Nuclear sclerotic cataract of the right eye.   POSTOPERATIVE DIAGNOSIS:  nuclear sclerotic cataract right eye   OPERATIVE PROCEDURE: Procedure(s): CATARACT EXTRACTION PHACO AND INTRAOCULAR LENS PLACEMENT (IOC)   SURGEON:  Birder Robson, MD.   ANESTHESIA:  Anesthesiologist: Gunnar Bulla, MD CRNA: Silvana Newness, CRNA  1.      Managed anesthesia care. 2.      0.29ml of Shugarcaine was instilled in the eye following the paracentesis.   COMPLICATIONS:  None.   TECHNIQUE:   Stop and chop   DESCRIPTION OF PROCEDURE:  The patient was examined and consented in the preoperative holding area where the aforementioned topical anesthesia was applied to the right eye and then brought back to the Operating Room where the right eye was prepped and draped in the usual sterile ophthalmic fashion and a lid speculum was placed. A paracentesis was created with the side port blade and the anterior chamber was filled with viscoelastic. A near clear corneal incision was performed with the steel keratome. A continuous curvilinear capsulorrhexis was performed with a cystotome followed by the capsulorrhexis forceps. Hydrodissection and hydrodelineation were carried out with BSS on a blunt cannula. The lens was removed in a stop and chop  technique and the remaining cortical material was removed with the irrigation-aspiration handpiece. The capsular bag was inflated with viscoelastic and the Technis [id number]  lens was placed in the capsular bag without complication. The remaining viscoelastic was removed from the eye with the irrigation-aspiration handpiece. The wounds were hydrated. The anterior chamber was flushed with Miostat and the eye was inflated to physiologic pressure. 0.33ml of Vigamox was placed in the anterior chamber. The wounds were found to be water tight. The eye was dressed with Vigamox. The patient was given protective glasses to wear throughout the day and a shield with which to  sleep tonight. The patient was also given drops with which to begin a drop regimen today and will follow-up with me in one day. * No implants in log * Procedure(s) with comments: CATARACT EXTRACTION PHACO AND INTRAOCULAR LENS PLACEMENT (IOC) (Right) - [place] 00:42 AP% 19.4 CDE 8.23 Fluid pack lot # [id number]  Electronically signed: [place] 09/24/2017 11:38 AM

## 2017-09-24 NOTE — Transfer of Care (Signed)
Immediate Anesthesia Transfer of Care Note  Patient: Richard Fry  Procedure(s) Performed: CATARACT EXTRACTION PHACO AND INTRAOCULAR LENS PLACEMENT (IOC) (Right Eye)  Patient Location: PACU  Anesthesia Type:MAC  Level of Consciousness: awake, alert , oriented and patient cooperative  Airway & Oxygen Therapy: Patient Spontanous Breathing  Post-op Assessment: Report given to RN, Post -op Vital signs reviewed and stable and Patient moving all extremities X 4  Post vital signs: Reviewed and stable  Last Vitals:  Vitals:   09/24/17 0941 09/24/17 1140  BP: (!) 152/74 131/63  Pulse: 65   Resp: 15   Temp: 36.5 C 36.5 C  SpO2: 100% 99%    Last Pain:  Vitals:   09/24/17 0941  TempSrc: Oral         Complications: No apparent anesthesia complications

## 2017-11-04 DIAGNOSIS — Z0000 Encounter for general adult medical examination without abnormal findings: Secondary | ICD-10-CM | POA: Diagnosis not present

## 2017-11-04 DIAGNOSIS — Z96642 Presence of left artificial hip joint: Secondary | ICD-10-CM | POA: Diagnosis not present

## 2017-11-04 DIAGNOSIS — E890 Postprocedural hypothyroidism: Secondary | ICD-10-CM | POA: Diagnosis not present

## 2017-11-04 DIAGNOSIS — Z23 Encounter for immunization: Secondary | ICD-10-CM | POA: Diagnosis not present

## 2017-11-04 DIAGNOSIS — D696 Thrombocytopenia, unspecified: Secondary | ICD-10-CM | POA: Diagnosis not present

## 2017-11-04 DIAGNOSIS — K219 Gastro-esophageal reflux disease without esophagitis: Secondary | ICD-10-CM | POA: Diagnosis not present

## 2017-11-26 DIAGNOSIS — D696 Thrombocytopenia, unspecified: Secondary | ICD-10-CM | POA: Diagnosis not present

## 2017-11-26 DIAGNOSIS — K219 Gastro-esophageal reflux disease without esophagitis: Secondary | ICD-10-CM | POA: Diagnosis not present

## 2017-11-26 DIAGNOSIS — J4520 Mild intermittent asthma, uncomplicated: Secondary | ICD-10-CM | POA: Diagnosis not present

## 2017-11-26 DIAGNOSIS — E890 Postprocedural hypothyroidism: Secondary | ICD-10-CM | POA: Diagnosis not present

## 2017-11-26 DIAGNOSIS — D649 Anemia, unspecified: Secondary | ICD-10-CM | POA: Diagnosis not present

## 2017-12-16 DIAGNOSIS — D649 Anemia, unspecified: Secondary | ICD-10-CM | POA: Diagnosis not present

## 2018-04-14 DIAGNOSIS — Z86010 Personal history of colonic polyps: Secondary | ICD-10-CM | POA: Diagnosis not present

## 2018-04-28 DIAGNOSIS — H353131 Nonexudative age-related macular degeneration, bilateral, early dry stage: Secondary | ICD-10-CM | POA: Diagnosis not present

## 2018-06-10 DIAGNOSIS — Z7689 Persons encountering health services in other specified circumstances: Secondary | ICD-10-CM | POA: Diagnosis not present

## 2018-06-10 DIAGNOSIS — R0789 Other chest pain: Secondary | ICD-10-CM | POA: Diagnosis not present

## 2018-06-19 ENCOUNTER — Encounter: Payer: Self-pay | Admitting: *Deleted

## 2018-06-20 ENCOUNTER — Ambulatory Visit
Admission: RE | Admit: 2018-06-20 | Discharge: 2018-06-20 | Disposition: A | Discharge status: Home / Self Care | Payer: PPO | Source: Ambulatory Visit | Attending: Gastroenterology | Admitting: Gastroenterology

## 2018-06-20 ENCOUNTER — Encounter
Admission: RE | Disposition: A | Discharge status: Home / Self Care | Payer: Self-pay | Source: Ambulatory Visit | Attending: Gastroenterology

## 2018-06-20 DIAGNOSIS — Z1211 Encounter for screening for malignant neoplasm of colon: Secondary | ICD-10-CM | POA: Diagnosis not present

## 2018-06-20 DIAGNOSIS — Z86010 Personal history of colonic polyps: Secondary | ICD-10-CM | POA: Insufficient documentation

## 2018-06-20 DIAGNOSIS — Z5309 Procedure and treatment not carried out because of other contraindication: Secondary | ICD-10-CM | POA: Insufficient documentation

## 2018-06-20 HISTORY — DX: Calculus of kidney: N20.0

## 2018-06-20 HISTORY — DX: Klinefelter syndrome, unspecified: Q98.4

## 2018-06-20 HISTORY — DX: Benign prostatic hyperplasia without lower urinary tract symptoms: N40.0

## 2018-06-20 HISTORY — DX: Diverticulosis of intestine, part unspecified, without perforation or abscess without bleeding: K57.90

## 2018-06-20 HISTORY — DX: Essential (primary) hypertension: I10

## 2018-06-20 HISTORY — DX: Zoster without complications: B02.9

## 2018-06-20 LAB — CBC WITH DIFFERENTIAL/PLATELET
Basophils Absolute: 0 10*3/uL (ref 0–0.1)
Basophils Relative: 0 %
Eosinophils Absolute: 0.1 10*3/uL (ref 0–0.7)
Eosinophils Relative: 2 %
HEMATOCRIT: 38.2 % — AB (ref 40.0–52.0)
HEMOGLOBIN: 12.9 g/dL — AB (ref 13.0–18.0)
LYMPHS ABS: 1 10*3/uL (ref 1.0–3.6)
LYMPHS PCT: 20 %
MCH: 29.8 pg (ref 26.0–34.0)
MCHC: 33.8 g/dL (ref 32.0–36.0)
MCV: 88.3 fL (ref 80.0–100.0)
Monocytes Absolute: 0.5 10*3/uL (ref 0.2–1.0)
Monocytes Relative: 10 %
NEUTROS ABS: 3.5 10*3/uL (ref 1.4–6.5)
NEUTROS PCT: 68 %
Platelets: 147 10*3/uL — ABNORMAL LOW (ref 150–440)
RBC: 4.32 MIL/uL — AB (ref 4.40–5.90)
RDW: 13.7 % (ref 11.5–14.5)
WBC: 5.1 10*3/uL (ref 3.8–10.6)

## 2018-06-20 SURGERY — COLONOSCOPY WITH PROPOFOL
Anesthesia: General

## 2018-06-20 MED ORDER — SODIUM CHLORIDE 0.9 % IV SOLN: INTRAVENOUS | Status: DC

## 2018-06-20 MED ORDER — LIDOCAINE HCL (PF) 1 % IJ SOLN: 2.0000 mL | Freq: Once | INTRAMUSCULAR | Status: DC

## 2018-06-20 NOTE — OR Nursing (Signed)
During pre-operative interview, patient relayed change in cardiac history with recent radiating chest pain. Is seeing a cardiologist with a stress test scheduled next week. Dr. Gustavo Lah and anesthesia notified. Decision to cancel procedure today and reschedule after cardiac evaluation.

## 2018-06-20 NOTE — H&P (Signed)
Patient is a 84 year old male presenting today for colonoscopy.  However in preprocedure questioning related that he had seen his cardiologist for problems of chest pain last week and a stress test is scheduled for this coming week.  We were unaware of this change in his medical history.  As such I feel that it would be best to hold off on doing his colonoscopy until his cardiac evaluation is completed.  I discussed this at length with both him and his niece who brought him and they are in agreement.  Our office will be back in touch with him for further management.

## 2018-06-30 DIAGNOSIS — Z125 Encounter for screening for malignant neoplasm of prostate: Secondary | ICD-10-CM | POA: Diagnosis not present

## 2018-06-30 DIAGNOSIS — J4520 Mild intermittent asthma, uncomplicated: Secondary | ICD-10-CM | POA: Diagnosis not present

## 2018-06-30 DIAGNOSIS — K219 Gastro-esophageal reflux disease without esophagitis: Secondary | ICD-10-CM | POA: Diagnosis not present

## 2018-06-30 DIAGNOSIS — D696 Thrombocytopenia, unspecified: Secondary | ICD-10-CM | POA: Diagnosis not present

## 2018-06-30 DIAGNOSIS — E890 Postprocedural hypothyroidism: Secondary | ICD-10-CM | POA: Diagnosis not present

## 2018-06-30 DIAGNOSIS — D649 Anemia, unspecified: Secondary | ICD-10-CM | POA: Diagnosis not present

## 2018-07-11 DIAGNOSIS — K219 Gastro-esophageal reflux disease without esophagitis: Secondary | ICD-10-CM | POA: Diagnosis not present

## 2018-07-11 DIAGNOSIS — D696 Thrombocytopenia, unspecified: Secondary | ICD-10-CM | POA: Diagnosis not present

## 2018-07-11 DIAGNOSIS — Z0000 Encounter for general adult medical examination without abnormal findings: Secondary | ICD-10-CM | POA: Diagnosis not present

## 2018-07-11 DIAGNOSIS — E890 Postprocedural hypothyroidism: Secondary | ICD-10-CM | POA: Diagnosis not present

## 2018-07-11 DIAGNOSIS — J4520 Mild intermittent asthma, uncomplicated: Secondary | ICD-10-CM | POA: Diagnosis not present

## 2018-07-16 DIAGNOSIS — R0789 Other chest pain: Secondary | ICD-10-CM | POA: Diagnosis not present

## 2018-08-12 DIAGNOSIS — Z23 Encounter for immunization: Secondary | ICD-10-CM | POA: Diagnosis not present

## 2018-10-15 DIAGNOSIS — D696 Thrombocytopenia, unspecified: Secondary | ICD-10-CM | POA: Diagnosis not present

## 2018-10-15 DIAGNOSIS — J4520 Mild intermittent asthma, uncomplicated: Secondary | ICD-10-CM | POA: Diagnosis not present

## 2018-10-15 DIAGNOSIS — R05 Cough: Secondary | ICD-10-CM | POA: Diagnosis not present

## 2018-10-15 DIAGNOSIS — R0789 Other chest pain: Secondary | ICD-10-CM | POA: Diagnosis not present

## 2019-01-08 DIAGNOSIS — Z0000 Encounter for general adult medical examination without abnormal findings: Secondary | ICD-10-CM | POA: Diagnosis not present

## 2019-01-08 DIAGNOSIS — K219 Gastro-esophageal reflux disease without esophagitis: Secondary | ICD-10-CM | POA: Diagnosis not present

## 2019-01-08 DIAGNOSIS — J4520 Mild intermittent asthma, uncomplicated: Secondary | ICD-10-CM | POA: Diagnosis not present

## 2019-01-08 DIAGNOSIS — E890 Postprocedural hypothyroidism: Secondary | ICD-10-CM | POA: Diagnosis not present

## 2019-01-08 DIAGNOSIS — D696 Thrombocytopenia, unspecified: Secondary | ICD-10-CM | POA: Diagnosis not present

## 2019-01-15 DIAGNOSIS — K219 Gastro-esophageal reflux disease without esophagitis: Secondary | ICD-10-CM | POA: Diagnosis not present

## 2019-01-15 DIAGNOSIS — E890 Postprocedural hypothyroidism: Secondary | ICD-10-CM | POA: Diagnosis not present

## 2019-01-15 DIAGNOSIS — D649 Anemia, unspecified: Secondary | ICD-10-CM | POA: Diagnosis not present

## 2019-01-15 DIAGNOSIS — R05 Cough: Secondary | ICD-10-CM | POA: Diagnosis not present

## 2019-01-15 DIAGNOSIS — D696 Thrombocytopenia, unspecified: Secondary | ICD-10-CM | POA: Diagnosis not present

## 2019-01-15 DIAGNOSIS — Z0000 Encounter for general adult medical examination without abnormal findings: Secondary | ICD-10-CM | POA: Diagnosis not present

## 2019-05-05 DIAGNOSIS — R0789 Other chest pain: Secondary | ICD-10-CM | POA: Diagnosis not present

## 2019-05-05 DIAGNOSIS — K219 Gastro-esophageal reflux disease without esophagitis: Secondary | ICD-10-CM | POA: Diagnosis not present

## 2019-05-28 DIAGNOSIS — D649 Anemia, unspecified: Secondary | ICD-10-CM | POA: Diagnosis not present

## 2019-05-28 DIAGNOSIS — R05 Cough: Secondary | ICD-10-CM | POA: Diagnosis not present

## 2019-05-28 DIAGNOSIS — K219 Gastro-esophageal reflux disease without esophagitis: Secondary | ICD-10-CM | POA: Diagnosis not present

## 2019-05-28 DIAGNOSIS — E890 Postprocedural hypothyroidism: Secondary | ICD-10-CM | POA: Diagnosis not present

## 2019-05-28 DIAGNOSIS — D696 Thrombocytopenia, unspecified: Secondary | ICD-10-CM | POA: Diagnosis not present

## 2019-06-04 DIAGNOSIS — E890 Postprocedural hypothyroidism: Secondary | ICD-10-CM | POA: Diagnosis not present

## 2019-06-04 DIAGNOSIS — M25552 Pain in left hip: Secondary | ICD-10-CM | POA: Diagnosis not present

## 2019-06-04 DIAGNOSIS — K219 Gastro-esophageal reflux disease without esophagitis: Secondary | ICD-10-CM | POA: Diagnosis not present

## 2019-06-04 DIAGNOSIS — Z23 Encounter for immunization: Secondary | ICD-10-CM | POA: Diagnosis not present

## 2019-06-04 DIAGNOSIS — W010XXA Fall on same level from slipping, tripping and stumbling without subsequent striking against object, initial encounter: Secondary | ICD-10-CM | POA: Diagnosis not present

## 2019-06-04 DIAGNOSIS — D649 Anemia, unspecified: Secondary | ICD-10-CM | POA: Diagnosis not present

## 2019-06-04 DIAGNOSIS — D696 Thrombocytopenia, unspecified: Secondary | ICD-10-CM | POA: Diagnosis not present

## 2019-06-05 DIAGNOSIS — M25552 Pain in left hip: Secondary | ICD-10-CM | POA: Diagnosis not present

## 2019-06-19 DIAGNOSIS — E890 Postprocedural hypothyroidism: Secondary | ICD-10-CM | POA: Diagnosis not present

## 2019-06-19 DIAGNOSIS — D649 Anemia, unspecified: Secondary | ICD-10-CM | POA: Diagnosis not present

## 2019-06-19 DIAGNOSIS — D696 Thrombocytopenia, unspecified: Secondary | ICD-10-CM | POA: Diagnosis not present

## 2019-06-30 DIAGNOSIS — M109 Gout, unspecified: Secondary | ICD-10-CM | POA: Diagnosis not present

## 2019-06-30 DIAGNOSIS — K219 Gastro-esophageal reflux disease without esophagitis: Secondary | ICD-10-CM | POA: Diagnosis not present

## 2019-06-30 DIAGNOSIS — I10 Essential (primary) hypertension: Secondary | ICD-10-CM | POA: Diagnosis not present

## 2019-06-30 DIAGNOSIS — M25552 Pain in left hip: Secondary | ICD-10-CM | POA: Diagnosis not present

## 2019-06-30 DIAGNOSIS — G4733 Obstructive sleep apnea (adult) (pediatric): Secondary | ICD-10-CM | POA: Diagnosis not present

## 2019-06-30 DIAGNOSIS — M7550 Bursitis of unspecified shoulder: Secondary | ICD-10-CM | POA: Diagnosis not present

## 2019-06-30 DIAGNOSIS — K5790 Diverticulosis of intestine, part unspecified, without perforation or abscess without bleeding: Secondary | ICD-10-CM | POA: Diagnosis not present

## 2019-06-30 DIAGNOSIS — E039 Hypothyroidism, unspecified: Secondary | ICD-10-CM | POA: Diagnosis not present

## 2019-06-30 DIAGNOSIS — Z87891 Personal history of nicotine dependence: Secondary | ICD-10-CM | POA: Diagnosis not present

## 2019-06-30 DIAGNOSIS — Z9181 History of falling: Secondary | ICD-10-CM | POA: Diagnosis not present

## 2019-06-30 DIAGNOSIS — J449 Chronic obstructive pulmonary disease, unspecified: Secondary | ICD-10-CM | POA: Diagnosis not present

## 2019-06-30 DIAGNOSIS — Z96642 Presence of left artificial hip joint: Secondary | ICD-10-CM | POA: Diagnosis not present

## 2019-06-30 DIAGNOSIS — Z8673 Personal history of transient ischemic attack (TIA), and cerebral infarction without residual deficits: Secondary | ICD-10-CM | POA: Diagnosis not present

## 2019-06-30 DIAGNOSIS — N400 Enlarged prostate without lower urinary tract symptoms: Secondary | ICD-10-CM | POA: Diagnosis not present

## 2019-06-30 DIAGNOSIS — N200 Calculus of kidney: Secondary | ICD-10-CM | POA: Diagnosis not present

## 2019-06-30 DIAGNOSIS — Q984 Klinefelter syndrome, unspecified: Secondary | ICD-10-CM | POA: Diagnosis not present

## 2019-06-30 DIAGNOSIS — Z96611 Presence of right artificial shoulder joint: Secondary | ICD-10-CM | POA: Diagnosis not present

## 2019-06-30 DIAGNOSIS — E785 Hyperlipidemia, unspecified: Secondary | ICD-10-CM | POA: Diagnosis not present

## 2019-06-30 DIAGNOSIS — I872 Venous insufficiency (chronic) (peripheral): Secondary | ICD-10-CM | POA: Diagnosis not present

## 2019-06-30 DIAGNOSIS — D649 Anemia, unspecified: Secondary | ICD-10-CM | POA: Diagnosis not present

## 2019-07-08 DIAGNOSIS — I872 Venous insufficiency (chronic) (peripheral): Secondary | ICD-10-CM | POA: Diagnosis not present

## 2019-07-08 DIAGNOSIS — G4733 Obstructive sleep apnea (adult) (pediatric): Secondary | ICD-10-CM | POA: Diagnosis not present

## 2019-07-08 DIAGNOSIS — E039 Hypothyroidism, unspecified: Secondary | ICD-10-CM | POA: Diagnosis not present

## 2019-07-08 DIAGNOSIS — K219 Gastro-esophageal reflux disease without esophagitis: Secondary | ICD-10-CM | POA: Diagnosis not present

## 2019-07-08 DIAGNOSIS — N400 Enlarged prostate without lower urinary tract symptoms: Secondary | ICD-10-CM | POA: Diagnosis not present

## 2019-07-08 DIAGNOSIS — Z9181 History of falling: Secondary | ICD-10-CM | POA: Diagnosis not present

## 2019-07-08 DIAGNOSIS — M109 Gout, unspecified: Secondary | ICD-10-CM | POA: Diagnosis not present

## 2019-07-08 DIAGNOSIS — K5790 Diverticulosis of intestine, part unspecified, without perforation or abscess without bleeding: Secondary | ICD-10-CM | POA: Diagnosis not present

## 2019-07-08 DIAGNOSIS — Z8673 Personal history of transient ischemic attack (TIA), and cerebral infarction without residual deficits: Secondary | ICD-10-CM | POA: Diagnosis not present

## 2019-07-08 DIAGNOSIS — N200 Calculus of kidney: Secondary | ICD-10-CM | POA: Diagnosis not present

## 2019-07-08 DIAGNOSIS — E785 Hyperlipidemia, unspecified: Secondary | ICD-10-CM | POA: Diagnosis not present

## 2019-07-08 DIAGNOSIS — D649 Anemia, unspecified: Secondary | ICD-10-CM | POA: Diagnosis not present

## 2019-07-08 DIAGNOSIS — Z96611 Presence of right artificial shoulder joint: Secondary | ICD-10-CM | POA: Diagnosis not present

## 2019-07-08 DIAGNOSIS — J449 Chronic obstructive pulmonary disease, unspecified: Secondary | ICD-10-CM | POA: Diagnosis not present

## 2019-07-08 DIAGNOSIS — M25552 Pain in left hip: Secondary | ICD-10-CM | POA: Diagnosis not present

## 2019-07-08 DIAGNOSIS — Z87891 Personal history of nicotine dependence: Secondary | ICD-10-CM | POA: Diagnosis not present

## 2019-07-08 DIAGNOSIS — I10 Essential (primary) hypertension: Secondary | ICD-10-CM | POA: Diagnosis not present

## 2019-07-08 DIAGNOSIS — Z96642 Presence of left artificial hip joint: Secondary | ICD-10-CM | POA: Diagnosis not present

## 2019-07-08 DIAGNOSIS — Q984 Klinefelter syndrome, unspecified: Secondary | ICD-10-CM | POA: Diagnosis not present

## 2019-07-08 DIAGNOSIS — M7550 Bursitis of unspecified shoulder: Secondary | ICD-10-CM | POA: Diagnosis not present

## 2019-07-14 DIAGNOSIS — D649 Anemia, unspecified: Secondary | ICD-10-CM | POA: Diagnosis not present

## 2019-07-14 DIAGNOSIS — G4733 Obstructive sleep apnea (adult) (pediatric): Secondary | ICD-10-CM | POA: Diagnosis not present

## 2019-07-14 DIAGNOSIS — I10 Essential (primary) hypertension: Secondary | ICD-10-CM | POA: Diagnosis not present

## 2019-07-14 DIAGNOSIS — Z9181 History of falling: Secondary | ICD-10-CM | POA: Diagnosis not present

## 2019-07-14 DIAGNOSIS — N200 Calculus of kidney: Secondary | ICD-10-CM | POA: Diagnosis not present

## 2019-07-14 DIAGNOSIS — M7550 Bursitis of unspecified shoulder: Secondary | ICD-10-CM | POA: Diagnosis not present

## 2019-07-14 DIAGNOSIS — J449 Chronic obstructive pulmonary disease, unspecified: Secondary | ICD-10-CM | POA: Diagnosis not present

## 2019-07-14 DIAGNOSIS — K5790 Diverticulosis of intestine, part unspecified, without perforation or abscess without bleeding: Secondary | ICD-10-CM | POA: Diagnosis not present

## 2019-07-14 DIAGNOSIS — M109 Gout, unspecified: Secondary | ICD-10-CM | POA: Diagnosis not present

## 2019-07-14 DIAGNOSIS — Z8673 Personal history of transient ischemic attack (TIA), and cerebral infarction without residual deficits: Secondary | ICD-10-CM | POA: Diagnosis not present

## 2019-07-14 DIAGNOSIS — N400 Enlarged prostate without lower urinary tract symptoms: Secondary | ICD-10-CM | POA: Diagnosis not present

## 2019-07-14 DIAGNOSIS — K219 Gastro-esophageal reflux disease without esophagitis: Secondary | ICD-10-CM | POA: Diagnosis not present

## 2019-07-14 DIAGNOSIS — E039 Hypothyroidism, unspecified: Secondary | ICD-10-CM | POA: Diagnosis not present

## 2019-07-14 DIAGNOSIS — E785 Hyperlipidemia, unspecified: Secondary | ICD-10-CM | POA: Diagnosis not present

## 2019-07-14 DIAGNOSIS — M25552 Pain in left hip: Secondary | ICD-10-CM | POA: Diagnosis not present

## 2019-07-14 DIAGNOSIS — Q984 Klinefelter syndrome, unspecified: Secondary | ICD-10-CM | POA: Diagnosis not present

## 2019-07-14 DIAGNOSIS — Z96642 Presence of left artificial hip joint: Secondary | ICD-10-CM | POA: Diagnosis not present

## 2019-07-14 DIAGNOSIS — I872 Venous insufficiency (chronic) (peripheral): Secondary | ICD-10-CM | POA: Diagnosis not present

## 2019-07-14 DIAGNOSIS — Z96611 Presence of right artificial shoulder joint: Secondary | ICD-10-CM | POA: Diagnosis not present

## 2019-07-14 DIAGNOSIS — Z87891 Personal history of nicotine dependence: Secondary | ICD-10-CM | POA: Diagnosis not present

## 2019-07-16 DIAGNOSIS — N400 Enlarged prostate without lower urinary tract symptoms: Secondary | ICD-10-CM | POA: Diagnosis not present

## 2019-07-16 DIAGNOSIS — I10 Essential (primary) hypertension: Secondary | ICD-10-CM | POA: Diagnosis not present

## 2019-07-16 DIAGNOSIS — M25552 Pain in left hip: Secondary | ICD-10-CM | POA: Diagnosis not present

## 2019-07-16 DIAGNOSIS — I872 Venous insufficiency (chronic) (peripheral): Secondary | ICD-10-CM | POA: Diagnosis not present

## 2019-07-16 DIAGNOSIS — J449 Chronic obstructive pulmonary disease, unspecified: Secondary | ICD-10-CM | POA: Diagnosis not present

## 2019-07-16 DIAGNOSIS — M109 Gout, unspecified: Secondary | ICD-10-CM | POA: Diagnosis not present

## 2019-07-16 DIAGNOSIS — D649 Anemia, unspecified: Secondary | ICD-10-CM | POA: Diagnosis not present

## 2019-07-29 DIAGNOSIS — D649 Anemia, unspecified: Secondary | ICD-10-CM | POA: Diagnosis not present

## 2019-07-29 DIAGNOSIS — Z96611 Presence of right artificial shoulder joint: Secondary | ICD-10-CM | POA: Diagnosis not present

## 2019-07-29 DIAGNOSIS — I872 Venous insufficiency (chronic) (peripheral): Secondary | ICD-10-CM | POA: Diagnosis not present

## 2019-07-29 DIAGNOSIS — K219 Gastro-esophageal reflux disease without esophagitis: Secondary | ICD-10-CM | POA: Diagnosis not present

## 2019-07-29 DIAGNOSIS — E039 Hypothyroidism, unspecified: Secondary | ICD-10-CM | POA: Diagnosis not present

## 2019-07-29 DIAGNOSIS — Z9181 History of falling: Secondary | ICD-10-CM | POA: Diagnosis not present

## 2019-07-29 DIAGNOSIS — N400 Enlarged prostate without lower urinary tract symptoms: Secondary | ICD-10-CM | POA: Diagnosis not present

## 2019-07-29 DIAGNOSIS — Z87891 Personal history of nicotine dependence: Secondary | ICD-10-CM | POA: Diagnosis not present

## 2019-07-29 DIAGNOSIS — Q984 Klinefelter syndrome, unspecified: Secondary | ICD-10-CM | POA: Diagnosis not present

## 2019-07-29 DIAGNOSIS — I10 Essential (primary) hypertension: Secondary | ICD-10-CM | POA: Diagnosis not present

## 2019-07-29 DIAGNOSIS — E785 Hyperlipidemia, unspecified: Secondary | ICD-10-CM | POA: Diagnosis not present

## 2019-07-29 DIAGNOSIS — M7550 Bursitis of unspecified shoulder: Secondary | ICD-10-CM | POA: Diagnosis not present

## 2019-07-29 DIAGNOSIS — K5790 Diverticulosis of intestine, part unspecified, without perforation or abscess without bleeding: Secondary | ICD-10-CM | POA: Diagnosis not present

## 2019-07-29 DIAGNOSIS — G4733 Obstructive sleep apnea (adult) (pediatric): Secondary | ICD-10-CM | POA: Diagnosis not present

## 2019-07-29 DIAGNOSIS — J449 Chronic obstructive pulmonary disease, unspecified: Secondary | ICD-10-CM | POA: Diagnosis not present

## 2019-07-29 DIAGNOSIS — Z8673 Personal history of transient ischemic attack (TIA), and cerebral infarction without residual deficits: Secondary | ICD-10-CM | POA: Diagnosis not present

## 2019-07-29 DIAGNOSIS — M25552 Pain in left hip: Secondary | ICD-10-CM | POA: Diagnosis not present

## 2019-07-29 DIAGNOSIS — M109 Gout, unspecified: Secondary | ICD-10-CM | POA: Diagnosis not present

## 2019-07-29 DIAGNOSIS — Z96642 Presence of left artificial hip joint: Secondary | ICD-10-CM | POA: Diagnosis not present

## 2019-07-29 DIAGNOSIS — N200 Calculus of kidney: Secondary | ICD-10-CM | POA: Diagnosis not present

## 2019-09-17 DIAGNOSIS — Z125 Encounter for screening for malignant neoplasm of prostate: Secondary | ICD-10-CM | POA: Diagnosis not present

## 2019-09-17 DIAGNOSIS — D696 Thrombocytopenia, unspecified: Secondary | ICD-10-CM | POA: Diagnosis not present

## 2019-09-17 DIAGNOSIS — D649 Anemia, unspecified: Secondary | ICD-10-CM | POA: Diagnosis not present

## 2019-09-17 DIAGNOSIS — M25552 Pain in left hip: Secondary | ICD-10-CM | POA: Diagnosis not present

## 2019-09-17 DIAGNOSIS — K219 Gastro-esophageal reflux disease without esophagitis: Secondary | ICD-10-CM | POA: Diagnosis not present

## 2019-09-17 DIAGNOSIS — E890 Postprocedural hypothyroidism: Secondary | ICD-10-CM | POA: Diagnosis not present

## 2019-09-17 DIAGNOSIS — Z9181 History of falling: Secondary | ICD-10-CM | POA: Diagnosis not present

## 2019-09-29 DIAGNOSIS — D649 Anemia, unspecified: Secondary | ICD-10-CM | POA: Diagnosis not present

## 2019-09-29 DIAGNOSIS — Z23 Encounter for immunization: Secondary | ICD-10-CM | POA: Diagnosis not present

## 2019-09-29 DIAGNOSIS — K219 Gastro-esophageal reflux disease without esophagitis: Secondary | ICD-10-CM | POA: Diagnosis not present

## 2019-09-29 DIAGNOSIS — Z0000 Encounter for general adult medical examination without abnormal findings: Secondary | ICD-10-CM | POA: Diagnosis not present

## 2019-09-29 DIAGNOSIS — D696 Thrombocytopenia, unspecified: Secondary | ICD-10-CM | POA: Diagnosis not present

## 2019-09-29 DIAGNOSIS — J4520 Mild intermittent asthma, uncomplicated: Secondary | ICD-10-CM | POA: Diagnosis not present

## 2019-09-29 DIAGNOSIS — E890 Postprocedural hypothyroidism: Secondary | ICD-10-CM | POA: Diagnosis not present

## 2020-01-22 DIAGNOSIS — E890 Postprocedural hypothyroidism: Secondary | ICD-10-CM | POA: Diagnosis not present

## 2020-01-22 DIAGNOSIS — D696 Thrombocytopenia, unspecified: Secondary | ICD-10-CM | POA: Diagnosis not present

## 2020-01-22 DIAGNOSIS — J4520 Mild intermittent asthma, uncomplicated: Secondary | ICD-10-CM | POA: Diagnosis not present

## 2020-01-22 DIAGNOSIS — K219 Gastro-esophageal reflux disease without esophagitis: Secondary | ICD-10-CM | POA: Diagnosis not present

## 2020-01-22 DIAGNOSIS — Z0000 Encounter for general adult medical examination without abnormal findings: Secondary | ICD-10-CM | POA: Diagnosis not present

## 2020-01-22 DIAGNOSIS — D649 Anemia, unspecified: Secondary | ICD-10-CM | POA: Diagnosis not present

## 2020-01-25 ENCOUNTER — Other Ambulatory Visit: Payer: Self-pay | Admitting: Internal Medicine

## 2020-01-25 DIAGNOSIS — R945 Abnormal results of liver function studies: Secondary | ICD-10-CM

## 2020-01-25 DIAGNOSIS — R7989 Other specified abnormal findings of blood chemistry: Secondary | ICD-10-CM

## 2020-01-28 ENCOUNTER — Ambulatory Visit
Admission: RE | Admit: 2020-01-28 | Discharge: 2020-01-28 | Disposition: A | Discharge status: Home / Self Care | Payer: PPO | Source: Ambulatory Visit | Attending: Internal Medicine | Admitting: Internal Medicine

## 2020-01-28 ENCOUNTER — Other Ambulatory Visit: Payer: Self-pay

## 2020-01-28 DIAGNOSIS — R945 Abnormal results of liver function studies: Secondary | ICD-10-CM | POA: Insufficient documentation

## 2020-01-28 DIAGNOSIS — R7989 Other specified abnormal findings of blood chemistry: Secondary | ICD-10-CM

## 2020-01-28 DIAGNOSIS — K8020 Calculus of gallbladder without cholecystitis without obstruction: Secondary | ICD-10-CM | POA: Diagnosis not present

## 2020-01-29 ENCOUNTER — Other Ambulatory Visit: Payer: Self-pay | Admitting: Internal Medicine

## 2020-01-29 ENCOUNTER — Ambulatory Visit: Payer: PPO

## 2020-01-29 DIAGNOSIS — Z0000 Encounter for general adult medical examination without abnormal findings: Secondary | ICD-10-CM | POA: Diagnosis not present

## 2020-01-29 DIAGNOSIS — R945 Abnormal results of liver function studies: Secondary | ICD-10-CM

## 2020-01-29 DIAGNOSIS — J4520 Mild intermittent asthma, uncomplicated: Secondary | ICD-10-CM | POA: Diagnosis not present

## 2020-01-29 DIAGNOSIS — R7989 Other specified abnormal findings of blood chemistry: Secondary | ICD-10-CM

## 2020-01-29 DIAGNOSIS — E890 Postprocedural hypothyroidism: Secondary | ICD-10-CM | POA: Diagnosis not present

## 2020-01-29 DIAGNOSIS — D649 Anemia, unspecified: Secondary | ICD-10-CM | POA: Diagnosis not present

## 2020-01-29 DIAGNOSIS — K8020 Calculus of gallbladder without cholecystitis without obstruction: Secondary | ICD-10-CM | POA: Diagnosis not present

## 2020-02-02 ENCOUNTER — Emergency Department: Payer: PPO

## 2020-02-02 ENCOUNTER — Inpatient Hospital Stay: Payer: PPO

## 2020-02-02 ENCOUNTER — Inpatient Hospital Stay
Admission: EM | Admit: 2020-02-02 | Discharge: 2020-02-08 | DRG: 981 | Disposition: E | Payer: PPO | Attending: Internal Medicine | Admitting: Internal Medicine

## 2020-02-02 ENCOUNTER — Encounter: Payer: Self-pay | Admitting: Emergency Medicine

## 2020-02-02 ENCOUNTER — Other Ambulatory Visit: Payer: Self-pay

## 2020-02-02 DIAGNOSIS — I2699 Other pulmonary embolism without acute cor pulmonale: Secondary | ICD-10-CM | POA: Diagnosis not present

## 2020-02-02 DIAGNOSIS — R6521 Severe sepsis with septic shock: Secondary | ICD-10-CM | POA: Diagnosis not present

## 2020-02-02 DIAGNOSIS — Z7952 Long term (current) use of systemic steroids: Secondary | ICD-10-CM

## 2020-02-02 DIAGNOSIS — I82409 Acute embolism and thrombosis of unspecified deep veins of unspecified lower extremity: Secondary | ICD-10-CM

## 2020-02-02 DIAGNOSIS — I959 Hypotension, unspecified: Secondary | ICD-10-CM | POA: Diagnosis not present

## 2020-02-02 DIAGNOSIS — N179 Acute kidney failure, unspecified: Secondary | ICD-10-CM | POA: Diagnosis present

## 2020-02-02 DIAGNOSIS — Z8739 Personal history of other diseases of the musculoskeletal system and connective tissue: Secondary | ICD-10-CM

## 2020-02-02 DIAGNOSIS — K219 Gastro-esophageal reflux disease without esophagitis: Secondary | ICD-10-CM | POA: Diagnosis present

## 2020-02-02 DIAGNOSIS — E039 Hypothyroidism, unspecified: Secondary | ICD-10-CM | POA: Diagnosis present

## 2020-02-02 DIAGNOSIS — D696 Thrombocytopenia, unspecified: Secondary | ICD-10-CM | POA: Diagnosis not present

## 2020-02-02 DIAGNOSIS — I82431 Acute embolism and thrombosis of right popliteal vein: Secondary | ICD-10-CM | POA: Diagnosis not present

## 2020-02-02 DIAGNOSIS — I82531 Chronic embolism and thrombosis of right popliteal vein: Secondary | ICD-10-CM | POA: Diagnosis present

## 2020-02-02 DIAGNOSIS — R079 Chest pain, unspecified: Secondary | ICD-10-CM | POA: Diagnosis present

## 2020-02-02 DIAGNOSIS — Z9841 Cataract extraction status, right eye: Secondary | ICD-10-CM

## 2020-02-02 DIAGNOSIS — R17 Unspecified jaundice: Secondary | ICD-10-CM | POA: Diagnosis not present

## 2020-02-02 DIAGNOSIS — Z20822 Contact with and (suspected) exposure to covid-19: Secondary | ICD-10-CM | POA: Diagnosis present

## 2020-02-02 DIAGNOSIS — Z8673 Personal history of transient ischemic attack (TIA), and cerebral infarction without residual deficits: Secondary | ICD-10-CM

## 2020-02-02 DIAGNOSIS — F05 Delirium due to known physiological condition: Secondary | ICD-10-CM | POA: Diagnosis present

## 2020-02-02 DIAGNOSIS — K8032 Calculus of bile duct with acute cholangitis without obstruction: Secondary | ICD-10-CM | POA: Diagnosis not present

## 2020-02-02 DIAGNOSIS — R0789 Other chest pain: Secondary | ICD-10-CM | POA: Diagnosis not present

## 2020-02-02 DIAGNOSIS — R109 Unspecified abdominal pain: Secondary | ICD-10-CM | POA: Diagnosis not present

## 2020-02-02 DIAGNOSIS — Z87442 Personal history of urinary calculi: Secondary | ICD-10-CM

## 2020-02-02 DIAGNOSIS — Z961 Presence of intraocular lens: Secondary | ICD-10-CM | POA: Diagnosis present

## 2020-02-02 DIAGNOSIS — I82421 Acute embolism and thrombosis of right iliac vein: Secondary | ICD-10-CM | POA: Diagnosis present

## 2020-02-02 DIAGNOSIS — J449 Chronic obstructive pulmonary disease, unspecified: Secondary | ICD-10-CM | POA: Diagnosis present

## 2020-02-02 DIAGNOSIS — K8071 Calculus of gallbladder and bile duct without cholecystitis with obstruction: Secondary | ICD-10-CM | POA: Diagnosis not present

## 2020-02-02 DIAGNOSIS — I82521 Chronic embolism and thrombosis of right iliac vein: Secondary | ICD-10-CM | POA: Diagnosis present

## 2020-02-02 DIAGNOSIS — Q984 Klinefelter syndrome, unspecified: Secondary | ICD-10-CM

## 2020-02-02 DIAGNOSIS — Z79890 Hormone replacement therapy (postmenopausal): Secondary | ICD-10-CM

## 2020-02-02 DIAGNOSIS — I82401 Acute embolism and thrombosis of unspecified deep veins of right lower extremity: Secondary | ICD-10-CM | POA: Diagnosis not present

## 2020-02-02 DIAGNOSIS — Z96642 Presence of left artificial hip joint: Secondary | ICD-10-CM | POA: Diagnosis not present

## 2020-02-02 DIAGNOSIS — I82411 Acute embolism and thrombosis of right femoral vein: Secondary | ICD-10-CM | POA: Diagnosis not present

## 2020-02-02 DIAGNOSIS — I4891 Unspecified atrial fibrillation: Secondary | ICD-10-CM | POA: Diagnosis not present

## 2020-02-02 DIAGNOSIS — Z66 Do not resuscitate: Secondary | ICD-10-CM | POA: Diagnosis present

## 2020-02-02 DIAGNOSIS — R0682 Tachypnea, not elsewhere classified: Secondary | ICD-10-CM

## 2020-02-02 DIAGNOSIS — R1011 Right upper quadrant pain: Secondary | ICD-10-CM | POA: Diagnosis not present

## 2020-02-02 DIAGNOSIS — R509 Fever, unspecified: Secondary | ICD-10-CM | POA: Diagnosis not present

## 2020-02-02 DIAGNOSIS — K828 Other specified diseases of gallbladder: Secondary | ICD-10-CM | POA: Diagnosis not present

## 2020-02-02 DIAGNOSIS — K8070 Calculus of gallbladder and bile duct without cholecystitis without obstruction: Secondary | ICD-10-CM | POA: Diagnosis not present

## 2020-02-02 DIAGNOSIS — I82511 Chronic embolism and thrombosis of right femoral vein: Secondary | ICD-10-CM | POA: Diagnosis not present

## 2020-02-02 DIAGNOSIS — G4730 Sleep apnea, unspecified: Secondary | ICD-10-CM | POA: Diagnosis present

## 2020-02-02 DIAGNOSIS — A419 Sepsis, unspecified organism: Secondary | ICD-10-CM | POA: Diagnosis not present

## 2020-02-02 DIAGNOSIS — Z139 Encounter for screening, unspecified: Secondary | ICD-10-CM

## 2020-02-02 DIAGNOSIS — Z8619 Personal history of other infectious and parasitic diseases: Secondary | ICD-10-CM

## 2020-02-02 DIAGNOSIS — I10 Essential (primary) hypertension: Secondary | ICD-10-CM | POA: Diagnosis not present

## 2020-02-02 DIAGNOSIS — K8309 Other cholangitis: Secondary | ICD-10-CM | POA: Diagnosis not present

## 2020-02-02 DIAGNOSIS — J9601 Acute respiratory failure with hypoxia: Secondary | ICD-10-CM | POA: Diagnosis not present

## 2020-02-02 DIAGNOSIS — Z9842 Cataract extraction status, left eye: Secondary | ICD-10-CM

## 2020-02-02 DIAGNOSIS — Z888 Allergy status to other drugs, medicaments and biological substances status: Secondary | ICD-10-CM

## 2020-02-02 DIAGNOSIS — G9340 Encephalopathy, unspecified: Secondary | ICD-10-CM | POA: Diagnosis not present

## 2020-02-02 DIAGNOSIS — Z471 Aftercare following joint replacement surgery: Secondary | ICD-10-CM | POA: Diagnosis not present

## 2020-02-02 DIAGNOSIS — Z7982 Long term (current) use of aspirin: Secondary | ICD-10-CM

## 2020-02-02 DIAGNOSIS — Z96653 Presence of artificial knee joint, bilateral: Secondary | ICD-10-CM | POA: Diagnosis present

## 2020-02-02 DIAGNOSIS — Z87891 Personal history of nicotine dependence: Secondary | ICD-10-CM

## 2020-02-02 DIAGNOSIS — R0989 Other specified symptoms and signs involving the circulatory and respiratory systems: Secondary | ICD-10-CM | POA: Diagnosis not present

## 2020-02-02 DIAGNOSIS — Z01818 Encounter for other preprocedural examination: Secondary | ICD-10-CM | POA: Diagnosis not present

## 2020-02-02 DIAGNOSIS — R0902 Hypoxemia: Secondary | ICD-10-CM | POA: Diagnosis not present

## 2020-02-02 DIAGNOSIS — J440 Chronic obstructive pulmonary disease with acute lower respiratory infection: Secondary | ICD-10-CM | POA: Diagnosis not present

## 2020-02-02 DIAGNOSIS — K8050 Calculus of bile duct without cholangitis or cholecystitis without obstruction: Secondary | ICD-10-CM | POA: Diagnosis not present

## 2020-02-02 DIAGNOSIS — M79604 Pain in right leg: Secondary | ICD-10-CM

## 2020-02-02 DIAGNOSIS — Z79899 Other long term (current) drug therapy: Secondary | ICD-10-CM

## 2020-02-02 DIAGNOSIS — Z8719 Personal history of other diseases of the digestive system: Secondary | ICD-10-CM

## 2020-02-02 LAB — SARS CORONAVIRUS 2 (TAT 6-24 HRS): SARS Coronavirus 2: NEGATIVE

## 2020-02-02 LAB — TROPONIN I (HIGH SENSITIVITY)
Troponin I (High Sensitivity): 6 ng/L (ref <18)
Troponin I (High Sensitivity): 6 ng/L (ref <18)
Troponin I (High Sensitivity): 7 ng/L (ref <18)
Troponin I (High Sensitivity): 7 ng/L (ref <18)
Troponin I (High Sensitivity): 8 ng/L (ref <18)

## 2020-02-02 LAB — TSH: TSH: 0.336 u[IU]/mL — ABNORMAL LOW (ref 0.350–4.500)

## 2020-02-02 LAB — COMPREHENSIVE METABOLIC PANEL
ALT: 27 U/L (ref 0–44)
AST: 39 U/L (ref 15–41)
Albumin: 4.3 g/dL (ref 3.5–5.0)
Alkaline Phosphatase: 68 U/L (ref 38–126)
Anion gap: 11 (ref 5–15)
BUN: 40 mg/dL — ABNORMAL HIGH (ref 8–23)
CO2: 22 mmol/L (ref 22–32)
Calcium: 9.5 mg/dL (ref 8.9–10.3)
Chloride: 107 mmol/L (ref 98–111)
Creatinine, Ser: 1.92 mg/dL — ABNORMAL HIGH (ref 0.61–1.24)
GFR calc Af Amer: 36 mL/min — ABNORMAL LOW (ref >60)
GFR calc non Af Amer: 31 mL/min — ABNORMAL LOW (ref >60)
Glucose, Bld: 106 mg/dL — ABNORMAL HIGH (ref 70–99)
Potassium: 4.9 mmol/L (ref 3.5–5.1)
Sodium: 140 mmol/L (ref 135–145)
Total Bilirubin: 1.6 mg/dL — ABNORMAL HIGH (ref 0.3–1.2)
Total Protein: 7.9 g/dL (ref 6.5–8.1)

## 2020-02-02 LAB — CBC
HCT: 42.4 % (ref 39.0–52.0)
Hemoglobin: 13.6 g/dL (ref 13.0–17.0)
MCH: 29.6 pg (ref 26.0–34.0)
MCHC: 32.1 g/dL (ref 30.0–36.0)
MCV: 92.4 fL (ref 80.0–100.0)
Platelets: 131 10*3/uL — ABNORMAL LOW (ref 150–400)
RBC: 4.59 MIL/uL (ref 4.22–5.81)
RDW: 13.8 % (ref 11.5–15.5)
WBC: 7.8 10*3/uL (ref 4.0–10.5)
nRBC: 0 % (ref 0.0–0.2)

## 2020-02-02 LAB — LIPID PANEL
Cholesterol: 180 mg/dL (ref 0–200)
HDL: 39 mg/dL — ABNORMAL LOW (ref >40)
LDL Cholesterol: 122 mg/dL — ABNORMAL HIGH (ref 0–99)
Total CHOL/HDL Ratio: 4.6 RATIO
Triglycerides: 93 mg/dL (ref <150)
VLDL: 19 mg/dL (ref 0–40)

## 2020-02-02 LAB — TYPE AND SCREEN
ABO/RH(D): AB NEG
Antibody Screen: NEGATIVE

## 2020-02-02 LAB — HEMOGLOBIN A1C
Hgb A1c MFr Bld: 5.4 % (ref 4.8–5.6)
Mean Plasma Glucose: 108.28 mg/dL

## 2020-02-02 LAB — FIBRIN DERIVATIVES D-DIMER (ARMC ONLY): Fibrin derivatives D-dimer (ARMC): >10000 ng/mL (FEU) — ABNORMAL HIGH (ref 0.00–499.00)

## 2020-02-02 LAB — T4, FREE: Free T4: 1.36 ng/dL — ABNORMAL HIGH (ref 0.61–1.12)

## 2020-02-02 LAB — GAMMA GT: GGT: 79 U/L — ABNORMAL HIGH (ref 7–50)

## 2020-02-02 MED ORDER — MONTELUKAST SODIUM 4 MG PO CHEW
4.0000 mg | CHEWABLE_TABLET | Freq: Every day | ORAL | Status: DC
  Filled 2020-02-02: qty 1

## 2020-02-02 MED ORDER — LEVOTHYROXINE SODIUM 50 MCG PO TABS
75.0000 ug | ORAL_TABLET | Freq: Every day | ORAL | Status: DC
  Administered 2020-02-03 – 2020-02-06 (×3): 75 ug via ORAL
  Filled 2020-02-02 (×3): qty 1

## 2020-02-02 MED ORDER — ACETAMINOPHEN 650 MG RE SUPP
650.0000 mg | Freq: Four times a day (QID) | RECTAL | Status: DC | PRN
  Administered 2020-02-07: 650 mg via RECTAL
  Filled 2020-02-02: qty 1

## 2020-02-02 MED ORDER — ALBUTEROL SULFATE (2.5 MG/3ML) 0.083% IN NEBU
3.0000 mL | INHALATION_SOLUTION | Freq: Four times a day (QID) | RESPIRATORY_TRACT | Status: DC | PRN
  Administered 2020-02-06: 15:00:00 3 mL via RESPIRATORY_TRACT
  Filled 2020-02-02: qty 3

## 2020-02-02 MED ORDER — HEPARIN SODIUM (PORCINE) 5000 UNIT/ML IJ SOLN
5000.0000 [IU] | Freq: Three times a day (TID) | INTRAMUSCULAR | Status: DC
  Administered 2020-02-02 – 2020-02-03 (×3): 5000 [IU] via SUBCUTANEOUS
  Filled 2020-02-02 (×3): qty 1

## 2020-02-02 MED ORDER — ACETAMINOPHEN 325 MG PO TABS
650.0000 mg | ORAL_TABLET | Freq: Four times a day (QID) | ORAL | Status: DC | PRN
  Administered 2020-02-04: 22:00:00 650 mg via ORAL

## 2020-02-02 MED ORDER — LACTATED RINGERS IV SOLN: INTRAVENOUS | Status: DC

## 2020-02-02 MED ORDER — LACTATED RINGERS IV SOLN
INTRAVENOUS | Status: AC
  Administered 2020-02-03: 950 mL via INTRAVENOUS

## 2020-02-02 MED ORDER — PANTOPRAZOLE SODIUM 40 MG IV SOLR
40.0000 mg | Freq: Two times a day (BID) | INTRAVENOUS | Status: DC
  Administered 2020-02-02 – 2020-02-05 (×8): 40 mg via INTRAVENOUS
  Filled 2020-02-02 (×8): qty 40

## 2020-02-02 NOTE — ED Notes (Signed)
Lab called to collect outstanding orders, lab states that orders will show on their worklist when pt is admitted to IP unit.

## 2020-02-02 NOTE — Assessment & Plan Note (Signed)
Blood pressure is within normal limits. Patient is currently not on any blood pressure medications.

## 2020-02-02 NOTE — Progress Notes (Signed)
Call the neighbor/care taker/ MPOA Alphonzo Dublin for medical questions.  CN:[phone]

## 2020-02-02 NOTE — H&P (Addendum)
History and Physical    Richard Fry [id number] DOB: August 04, 1934 DOA: 01/11/2020   PCP: Tracie Harrier, MD   Patient coming from: Home  Chief Complaint: Chest pain  HPI: Richard Fry is a 86 y.o. male with medical history significant of htn,hypothyroidism,RMSF seen in ed for chest pain, which pt then points to ruq pain. HPIis per edmd note pt is altered with sitter at bedside, he is delirius and says he lives in [place]. Per caretaker, at PCP appt few weeks ago it showed liver enzymes were high in the 300's and had usg was done and repeat usg today shows dilated CBD.   ED Course:  Blood pressure 123/72, pulse 67, temperature 98.6 F (37 C), temperature source Oral, resp. rate 18, height 6\' 1"  (1.854 m), weight 77.1 kg, SpO2 95 %. Initial blood work shows potassium of 4.9, creatinine of 1.92, AST of 39 ALT of 27, total bili of 1.6, initial troponin of 6. CBC shows normal white count of 7.8, hemoglobin of 13.6, platelets of 131, thrombocytopenia is chronic for the last 4 blood work.  Initial x-ray of the abdomen and chest showed no acute active chest disease, nonobstructive bowel gas pattern.  Review of Systems: pt is confused.   Past Medical History:  Diagnosis Date  . Asthma   . BPH (benign prostatic hyperplasia)   . COPD (chronic obstructive pulmonary disease) ([place])   . Diverticulosis   . GERD (gastroesophageal reflux disease)   . Gout   . Gout   . History of kidney stones   . Hypertension   . Hypothyroidism   . Klinefelter syndrome   . Nephrolithiasis   . [facility] spotted fever    history of....  . Shingles   . Sleep apnea    NO CPAP  . Stroke [facility])    TIA    Past Surgical History:  Procedure Laterality Date  . CATARACT EXTRACTION W/PHACO Left 08/13/2017   Procedure: CATARACT EXTRACTION PHACO AND INTRAOCULAR LENS PLACEMENT (IOC);  Surgeon: Birder Robson, MD;  Location: [facility] ORS;  Service: Ophthalmology;  Laterality: Left;  [place] 00:42.8 AP% 13.1 CDE  5.60 FLUID PACK LOT # [id number]  . CATARACT EXTRACTION W/PHACO Right 09/24/2017   Procedure: CATARACT EXTRACTION PHACO AND INTRAOCULAR LENS PLACEMENT (IOC);  Surgeon: Birder Robson, MD;  Location: [facility] ORS;  Service: Ophthalmology;  Laterality: Right;  [place] 00:42 AP% 19.4 CDE 8.23 Fluid pack lot # [id number] H  . JOINT REPLACEMENT     LEFT THR/  BIL TKR  . JOINT REPLACEMENT Bilateral    TKR     reports that he has quit smoking. His smoking use included cigarettes. He has never used smokeless tobacco. He reports that he does not drink alcohol or use drugs.  Allergies  Allergen Reactions  . Allopurinol Swelling  . Lasix [Furosemide]     History reviewed. No pertinent family history.   Prior to Admission medications   Medication Sig Start Date End Date Taking? Authorizing Provider  albuterol (PROVENTIL HFA;VENTOLIN HFA) 108 (90 Base) MCG/ACT inhaler Inhale 2 puffs into the lungs every 6 (six) hours as needed for wheezing or shortness of breath.     [provider]  aspirin EC 81 MG tablet Take 81 mg by mouth daily with breakfast.    [provider]  Calcium Carb-Cholecalciferol (CALCIUM + D3 PO) Take 0.5 tablets by mouth daily.    [provider]  Cyanocobalamin (VITAMIN B-12) 2500 MCG SUBL Place 2,500 mcg under the tongue daily with lunch.  [provider]  fexofenadine (ALLEGRA) 180 MG tablet Take 180 mg by mouth daily as needed (for allergies.).     [provider]  levothyroxine (SYNTHROID, LEVOTHROID) 75 MCG tablet Take 75 mcg by mouth daily before breakfast. 05/21/17   [provider]  Multiple Vitamin (MULTIVITAMIN WITH MINERALS) TABS tablet Take 1 tablet by mouth daily.    [provider]  Multiple Vitamins-Minerals (PRESERVISION AREDS 2 PO) Take 1 tablet by mouth daily.     [provider]  Omega-3 Fatty Acids (FISH OIL PO) Take 1 capsule by mouth daily with lunch.    [provider]  omeprazole  (PRILOSEC) 20 MG capsule Take 20 mg by mouth daily before breakfast.     [provider]  triamcinolone cream (KENALOG) 0.1 % Apply 1 application topically 2 (two) times daily as needed. For rash/irritated skin. 06/26/17   [provider]    Physical Exam: Vitals:   02/03/2020 1133 01/16/2020 1134 01/27/2020 1315 02/06/2020 1330  BP:      Pulse:   71 67  Resp:   18   Temp: 98.6 F (37 C)     TempSrc: Oral     SpO2:   99% 95%  Weight:  77.1 kg    Height:  6\' 1"  (1.854 m)      Constitutional: NAD, calm, comfortable Vitals:   01/25/2020 1133 02/01/2020 1134 02/01/2020 1315 01/24/2020 1330  BP:      Pulse:   71 67  Resp:   18   Temp: 98.6 F (37 C)     TempSrc: Oral     SpO2:   99% 95%  Weight:  77.1 kg    Height:  6\' 1"  ([id number] m)     Eyes: PERRL, lids and conjunctivae normal ENMT: Mucous membranes are moist. Posterior pharynx clear of any exudate or lesions.Normal dentition.  Neck: normal, supple, no masses, no thyromegaly Respiratory: clear to auscultation bilaterally, no wheezing, no crackles. Normal respiratory effort. No accessory muscle use.  Cardiovascular: Regular rate and rhythm, no murmurs / rubs / gallops. No extremity edema. 2+ pedal pulses. No carotid bruits.  Abdomen: no tenderness, no masses palpated. No hepatosplenomegaly. Bowel sounds positive.  Musculoskeletal: no clubbing / cyanosis. No joint deformity upper and lower extremities. Good ROM, no contractures. Normal muscle tone.  Skin: no rashes, lesions, ulcers. No induration Neurologic: CN 2-12 grossly intact. Sensation intact, DTR normal. Strength 5/5 in all 4.  Psychiatric: Normal judgment and insight. Alert and oriented x 3. Normal mood.   Labs on Admission: I have personally reviewed following labs and imaging studies  CBC: Recent Labs  Lab 01/23/2020 1114  WBC 7.8  HGB 13.6  HCT 42.4  MCV 92.4  PLT [id number]*   Basic Metabolic Panel: Recent Labs  Lab 01/17/2020 1114  NA 140  K 4.9  CL 107  CO2  22  GLUCOSE 106*  BUN 40*  CREATININE 1.92*  CALCIUM 9.5   GFR: Estimated Creatinine Clearance: 31.8 mL/min (A) (by C-G formula based on SCr of 1.92 mg/dL (H)). Liver Function Tests: Recent Labs  Lab 01/19/2020 1114  AST 39  ALT 27  ALKPHOS 68  BILITOT 1.6*  PROT 7.9  ALBUMIN 4.3    Radiological Exams on Admission: DG Abdomen Acute W/Chest  Result Date: 01/22/2020 CLINICAL DATA:  Chest and abdominal pain EXAM: DG ABDOMEN ACUTE W/ 1V CHEST COMPARISON:  05/23/2011 chest radiograph. FINDINGS: Stable cardiomediastinal silhouette with normal heart size. No pneumothorax. No pleural effusion. Lungs  appear clear, with no acute consolidative airspace disease and no pulmonary edema. Healed deformities in multiple posterior right mid ribs. No disproportionately dilated small bowel loops or air-fluid levels. No evidence of pneumatosis or pneumoperitoneum. No radiopaque nephrolithiasis. Partially visualized left total hip arthroplasty. Marked lumbar spondylosis. IMPRESSION: 1. No active disease in the chest. 2. Nonobstructive bowel gas pattern. Electronically Signed   By: Ilona Sorrel M.D.   On: 02/06/2020 11:52   US ABDOMEN LIMITED RUQ  Result Date: 01/13/2020 CLINICAL DATA:  Abdominal pain EXAM: ULTRASOUND ABDOMEN LIMITED RIGHT UPPER QUADRANT COMPARISON:  01/28/2020 FINDINGS: Gallbladder: Sludge and multiple small calculi measuring up to 4 mm are again seen. No wall thickening or pericholecystic fluid visualized. No sonographic Murphy sign noted by sonographer. Common bile duct: Diameter: Measures up to 8 mm, increased since the prior study. Liver: No focal lesion identified. Within normal limits in parenchymal echogenicity. Portal vein is patent on color Doppler imaging with normal direction of blood flow towards the liver. Other: None. IMPRESSION: Gallbladder sludge and small calculi as before. No sonographic evidence of acute cholecystitis. Increase in size of common bile duct. Distal obstruction is  not excluded. Electronically Signed   By: Macy Mis M.D.   On: 02/06/2020 12:14    EKG: Independently reviewed.  Sinus rhythm 72.  Assessment/Plan Active Problems:   Hypothyroidism   COPD (chronic obstructive pulmonary disease) (HCC)   Hypertension  Plan: Hypertension Assessment & Plan Blood pressure is within normal limits. Patient is currently not on any blood pressure medications.   COPD (chronic obstructive pulmonary disease) (HCC) Assessment & Plan Stable.  We will continue pt on his home regimen with albuterol and Singulair.    Hypothyroidism Assessment & Plan Pt to continue his home dose of L-thyroxine 75 mcg and will check tft's.    Chest pain Assessment & Plan Patient reports chest pain that is his right upper quadrant/right-sided chest wall pain. He is awake and alert gives limited history. We will admit patient to telemetry and cycle cardiac enzymes. Differentials also include choledocholithiasis. GI has been consulted per ED provider.  Is also scheduled for an MRCP. Stat GGT.  IV PPI. Obtain blood cultures if fever.   DVT prophylaxis: Heparin. Code Status: Full. Family Communication: None. Disposition Plan: Home.  Consults called: GI Dr.Vanga. Admission status: Inpatient.   Para Skeans MD [facility] If 7PM-7AM, please contact night-coverage [email or web address] Password [email or web address] 01/29/2020, 2:49 PM   5:27pm vicky edwards  [phone] is caretaker and neighbor. She is also his hpoa. Pt lives with wife and she takes care of them both.  Pt is DNR.

## 2020-02-02 NOTE — ED Notes (Signed)
Covid swab done - pt does not have order at this time. Sent secure chat to MD Posey Pronto to place order.

## 2020-02-02 NOTE — ED Triage Notes (Signed)
Pt ems from home for chest pain last nite that lasted 10-15 minutes. Pt denies having chest pain before and is unsure how to describe pain. Pt poor historian. Pt is c/o RUQ pain now that he says is same as last night. Pt given 324mg  asp by ems.

## 2020-02-02 NOTE — Progress Notes (Signed)
Alerted Dr. Posey Pronto that patient's Right leg is swollen, red and warm. Md stated she would order an ultra sound and to leave off SCDs. Will monitor closley.

## 2020-02-02 NOTE — ED Notes (Signed)
Pt attempting to get out of bed and "go home." Explained that pt was in ED and was up for admission. Pt confused, wife states confusion today is abnormal for pt (hx Klinefelter syndrome). Sitter called for pt. Agricultural consultant and EDP notified.

## 2020-02-02 NOTE — Assessment & Plan Note (Signed)
Stable.  We will continue pt on his home regimen with albuterol and Singulair.

## 2020-02-02 NOTE — ED Notes (Signed)
Unable to obtain additional labs ordered at this time d/t critical pt care in a different room. Pt has IP bed assigned now, phlebotomy to collect on floor.

## 2020-02-02 NOTE — Assessment & Plan Note (Signed)
Pt to continue his home dose of L-thyroxine 75 mcg and will check tft's.

## 2020-02-02 NOTE — ED Provider Notes (Signed)
[facility] Emergency Department Provider Note       Time seen: ----------------------------------------- 1:37 PM on 01/23/2020 -----------------------------------------   I have reviewed the triage vital signs and the nursing notes. Level V caveat: History/ROS limited by altered mental status HISTORY   Chief Complaint Chest Pain    HPI Richard Fry is a 86 y.o. male with a history of asthma, COPD, hypertension, kidney stones who presents to the ED for chest pain that started last night.  Patient is having intermittent chest and abdominal pain, seems to point to his right upper quadrant and states the same pain was occurring last night.  He denies any specific pain currently but then does later point to his right upper quadrant for pain.  Past Medical History:  Diagnosis Date  . Asthma   . BPH (benign prostatic hyperplasia)   . COPD (chronic obstructive pulmonary disease) ([place])   . Diverticulosis   . GERD (gastroesophageal reflux disease)   . Gout   . Gout   . History of kidney stones   . Hypertension   . Hypothyroidism   . Klinefelter syndrome   . Nephrolithiasis   . [facility] spotted fever    history of....  . Shingles   . Sleep apnea    NO CPAP  . Stroke [facility])    TIA    There are no problems to display for this patient.   Past Surgical History:  Procedure Laterality Date  . CATARACT EXTRACTION W/PHACO Left 08/13/2017   Procedure: CATARACT EXTRACTION PHACO AND INTRAOCULAR LENS PLACEMENT (IOC);  Surgeon: Birder Robson, MD;  Location: [facility] ORS;  Service: Ophthalmology;  Laterality: Left;  [place] 00:42.8 AP% 13.1 CDE 5.60 FLUID PACK LOT # [id number]  . CATARACT EXTRACTION W/PHACO Right 09/24/2017   Procedure: CATARACT EXTRACTION PHACO AND INTRAOCULAR LENS PLACEMENT (IOC);  Surgeon: Birder Robson, MD;  Location: [facility] ORS;  Service: Ophthalmology;  Laterality: Right;  [place] 00:42 AP% 19.4 CDE 8.23 Fluid pack lot # [id number]  . JOINT  REPLACEMENT     LEFT THR/  BIL TKR  . JOINT REPLACEMENT Bilateral    TKR    Allergies Allopurinol and Lasix [furosemide]  Social History Social History   Tobacco Use  . Smoking status: Former Smoker    Types: Cigarettes  . Smokeless tobacco: Never Used  Substance Use Topics  . Alcohol use: No  . Drug use: No    Review of Systems Constitutional: Negative for fever. Cardiovascular: Positive for chest pain Respiratory: Negative for shortness of breath. Gastrointestinal: Positive for abdominal pain Musculoskeletal: Negative for back pain. Skin: Negative for rash. Neurological: Negative for headaches, focal weakness or numbness.  All systems negative/normal/unremarkable except as stated in the HPI  ____________________________________________   PHYSICAL EXAM:  VITAL SIGNS: ED Triage Vitals  Enc Vitals Group     BP 01/23/2020 1118 123/72     Pulse Rate 02/01/2020 1118 71     Resp 01/23/2020 1118 20     Temp 01/13/2020 1133 98.6 F (37 C)     Temp Source 01/24/2020 1133 Oral     SpO2 01/31/2020 1118 100 %     Weight 01/24/2020 1134 170 lb (77.1 kg)     Height 02/06/2020 1134 6\' 1"  (1.854 m)     Head Circumference --      Peak Flow --      Pain Score 02/05/2020 1134 0     Pain Loc --      Pain Edu? --  Excl. in [place]? --     Constitutional: Alert and oriented. Well appearing and in no distress. Eyes: Conjunctivae are normal. Normal extraocular movements. ENT      Head: Normocephalic and atraumatic.      Nose: No congestion/rhinnorhea.      Mouth/Throat: Mucous membranes are moist.      Neck: No stridor. Cardiovascular: Normal rate, regular rhythm. No murmurs, rubs, or gallops. Respiratory: Normal respiratory effort without tachypnea nor retractions. Breath sounds are clear and equal bilaterally. No wheezes/rales/rhonchi. Gastrointestinal: Soft and nontender. Normal bowel sounds Musculoskeletal: Nontender with normal range of motion in extremities. No lower extremity  tenderness nor edema. Neurologic:  Normal speech and language. No gross focal neurologic deficits are appreciated.  Skin:  Skin is warm, dry and intact. No rash noted. Psychiatric: Mood and affect are normal. Speech and behavior are normal.  ____________________________________________  EKG: Interpreted by me.  Sinus rhythm with rate of 72 bpm, prolonged PR interval, normal QRS, normal QT  ____________________________________________  ED COURSE:  As part of my medical decision making, I reviewed the following data within the [place] History obtained from family if available, nursing notes, old chart and ekg, as well as notes from prior ED visits. Patient presented for chest and abdominal pain, we will assess with labs and imaging as indicated at this time.   Procedures  Rayn Amacker was evaluated in Emergency Department on 01/29/2020 for the symptoms described in the history of present illness. He was evaluated in the context of the global COVID-19 pandemic, which necessitated consideration that the patient might be at risk for infection with the SARS-CoV-2 virus that causes COVID-19. Institutional protocols and algorithms that pertain to the evaluation of patients at risk for COVID-19 are in a state of rapid change based on information released by regulatory bodies including the CDC and federal and state organizations. These policies and algorithms were followed during the patient's care in the ED.  ____________________________________________   LABS (pertinent positives/negatives)  Labs Reviewed  CBC - Abnormal; Notable for the following components:      Result Value   Platelets 131 (*)    All other components within normal limits  COMPREHENSIVE METABOLIC PANEL - Abnormal; Notable for the following components:   Glucose, Bld 106 (*)    BUN 40 (*)    Creatinine, Ser 1.92 (*)    Total Bilirubin 1.6 (*)    GFR calc non Af Amer 31 (*)    GFR calc Af Amer 36 (*)    All  other components within normal limits  TROPONIN I (HIGH SENSITIVITY)  TROPONIN I (HIGH SENSITIVITY)    RADIOLOGY Images were viewed by me  IMPRESSION: Gallbladder sludge and small calculi as before. No sonographic evidence of acute cholecystitis.  Increase in size of common bile duct. Distal obstruction is not excluded. IMPRESSION: 1. No active disease in the chest. 2. Nonobstructive bowel gas pattern.  ____________________________________________   DIFFERENTIAL DIAGNOSIS   Musculoskeletal pain, GERD, pancreatitis, biliary colic, cholecystitis, MI, PE, pneumothorax  FINAL ASSESSMENT AND PLAN  Abdominal pain, cholelithiasis, possible choledocholithiasis, AKI   Plan: The patient had presented for chest and abdominal pain. Patient's labs did indicate some acute kidney injury and slightly elevated bilirubin. Patient's imaging was concerning for possible common bile duct stone.  I will discuss with GI and likely medicine for admission and ERCP versus MRCP.   Laurence Aly, MD    Note: This note was generated in part or whole with voice  recognition software. Voice recognition is usually quite accurate but there are transcription errors that can and very often do occur. I apologize for any typographical errors that were not detected and corrected.     Earleen Newport, MD 01/14/2020 [phone]

## 2020-02-02 NOTE — Assessment & Plan Note (Signed)
Patient reports chest pain that is his right upper quadrant/right-sided chest wall pain. He is awake and alert gives limited history. We will admit patient to telemetry and cycle cardiac enzymes. Differentials also include choledocholithiasis. GI has been consulted per ED provider.  Is also scheduled for an MRCP. Stat GGT.  IV PPI. Obtain blood cultures if fever.

## 2020-02-03 ENCOUNTER — Other Ambulatory Visit (INDEPENDENT_AMBULATORY_CARE_PROVIDER_SITE_OTHER): Payer: Self-pay | Admitting: Vascular Surgery

## 2020-02-03 ENCOUNTER — Inpatient Hospital Stay: Payer: PPO

## 2020-02-03 ENCOUNTER — Inpatient Hospital Stay (HOSPITAL_COMMUNITY)
Admit: 2020-02-03 | Discharge: 2020-02-03 | Disposition: A | Discharge status: Home / Self Care | Payer: PPO | Attending: Internal Medicine | Admitting: Internal Medicine

## 2020-02-03 ENCOUNTER — Inpatient Hospital Stay: Admit: 2020-02-03 | Payer: PPO

## 2020-02-03 DIAGNOSIS — I82401 Acute embolism and thrombosis of unspecified deep veins of right lower extremity: Secondary | ICD-10-CM

## 2020-02-03 DIAGNOSIS — K8050 Calculus of bile duct without cholangitis or cholecystitis without obstruction: Secondary | ICD-10-CM

## 2020-02-03 DIAGNOSIS — I2699 Other pulmonary embolism without acute cor pulmonale: Secondary | ICD-10-CM

## 2020-02-03 DIAGNOSIS — J449 Chronic obstructive pulmonary disease, unspecified: Secondary | ICD-10-CM

## 2020-02-03 DIAGNOSIS — I10 Essential (primary) hypertension: Secondary | ICD-10-CM

## 2020-02-03 DIAGNOSIS — I82409 Acute embolism and thrombosis of unspecified deep veins of unspecified lower extremity: Secondary | ICD-10-CM

## 2020-02-03 LAB — CBC WITH DIFFERENTIAL/PLATELET
Abs Immature Granulocytes: 0.02 10*3/uL (ref 0.00–0.07)
Basophils Absolute: 0 10*3/uL (ref 0.0–0.1)
Basophils Relative: 1 %
Eosinophils Absolute: 0.2 10*3/uL (ref 0.0–0.5)
Eosinophils Relative: 3 %
HCT: 36.8 % — ABNORMAL LOW (ref 39.0–52.0)
Hemoglobin: 11.9 g/dL — ABNORMAL LOW (ref 13.0–17.0)
Immature Granulocytes: 0 %
Lymphocytes Relative: 20 %
Lymphs Abs: 1.2 10*3/uL (ref 0.7–4.0)
MCH: 29.5 pg (ref 26.0–34.0)
MCHC: 32.3 g/dL (ref 30.0–36.0)
MCV: 91.1 fL (ref 80.0–100.0)
Monocytes Absolute: 0.6 10*3/uL (ref 0.1–1.0)
Monocytes Relative: 11 %
Neutro Abs: 3.7 10*3/uL (ref 1.7–7.7)
Neutrophils Relative %: 65 %
Platelets: 108 10*3/uL — ABNORMAL LOW (ref 150–400)
RBC: 4.04 MIL/uL — ABNORMAL LOW (ref 4.22–5.81)
RDW: 13.6 % (ref 11.5–15.5)
WBC: 5.6 10*3/uL (ref 4.0–10.5)
nRBC: 0 % (ref 0.0–0.2)

## 2020-02-03 LAB — MAGNESIUM: Magnesium: 2 mg/dL (ref 1.7–2.4)

## 2020-02-03 LAB — HEPARIN LEVEL (UNFRACTIONATED)
Heparin Unfractionated: 1.68 IU/mL — ABNORMAL HIGH (ref 0.30–0.70)
Heparin Unfractionated: 1.57 IU/mL — ABNORMAL HIGH (ref 0.30–0.70)

## 2020-02-03 LAB — APTT: aPTT: 40 seconds — ABNORMAL HIGH (ref 24–36)

## 2020-02-03 LAB — COMPREHENSIVE METABOLIC PANEL
ALT: 19 U/L (ref 0–44)
AST: 24 U/L (ref 15–41)
Albumin: 3.6 g/dL (ref 3.5–5.0)
Alkaline Phosphatase: 57 U/L (ref 38–126)
Anion gap: 7 (ref 5–15)
BUN: 36 mg/dL — ABNORMAL HIGH (ref 8–23)
CO2: 24 mmol/L (ref 22–32)
Calcium: 9.1 mg/dL (ref 8.9–10.3)
Chloride: 110 mmol/L (ref 98–111)
Creatinine, Ser: 1.64 mg/dL — ABNORMAL HIGH (ref 0.61–1.24)
GFR calc Af Amer: 44 mL/min — ABNORMAL LOW (ref >60)
GFR calc non Af Amer: 38 mL/min — ABNORMAL LOW (ref >60)
Glucose, Bld: 98 mg/dL (ref 70–99)
Potassium: 3.9 mmol/L (ref 3.5–5.1)
Sodium: 141 mmol/L (ref 135–145)
Total Bilirubin: 1.2 mg/dL (ref 0.3–1.2)
Total Protein: 7.1 g/dL (ref 6.5–8.1)

## 2020-02-03 LAB — LIPASE, BLOOD: Lipase: 40 U/L (ref 11–51)

## 2020-02-03 LAB — AMMONIA: Ammonia: 25 umol/L (ref 9–35)

## 2020-02-03 LAB — URINE CULTURE: Culture: <10000 — AB

## 2020-02-03 LAB — PROTIME-INR
INR: 1.2 (ref 0.8–1.2)
INR: 1.1 (ref 0.8–1.2)
Prothrombin Time: 14.6 seconds (ref 11.4–15.2)
Prothrombin Time: 14.2 seconds (ref 11.4–15.2)

## 2020-02-03 MED ORDER — HEPARIN BOLUS VIA INFUSION
2000.0000 [IU] | Freq: Once | INTRAVENOUS | Status: AC
  Administered 2020-02-03: 12:00:00 2000 [IU] via INTRAVENOUS
  Filled 2020-02-03: qty 2000

## 2020-02-03 MED ORDER — HEPARIN (PORCINE) 25000 UT/250ML-% IV SOLN
700.0000 [IU]/h | INTRAVENOUS | Status: DC
  Administered 2020-02-04: 19:00:00 700 [IU]/h via INTRAVENOUS
  Filled 2020-02-03 (×2): qty 250

## 2020-02-03 MED ORDER — HALOPERIDOL LACTATE 5 MG/ML IJ SOLN
2.0000 mg | Freq: Once | INTRAMUSCULAR | Status: AC
  Administered 2020-02-03: 2 mg via INTRAVENOUS
  Filled 2020-02-03: qty 1

## 2020-02-03 MED ORDER — SODIUM CHLORIDE 0.9 % IV SOLN: INTRAVENOUS | Status: DC

## 2020-02-03 MED ORDER — HEPARIN (PORCINE) 25000 UT/250ML-% IV SOLN
1250.0000 [IU]/h | INTRAVENOUS | Status: DC
  Administered 2020-02-03: 12:00:00 1250 [IU]/h via INTRAVENOUS
  Filled 2020-02-03: qty 250

## 2020-02-03 NOTE — Consult Note (Signed)
ANTICOAGULATION CONSULT NOTE - Initial Consult  Pharmacy Consult for heparin drip Indication: DVT/VTE suspected (elevated D-Dimer > 10,000 and leg swelling/warmth pending ultrasound)  Allergies  Allergen Reactions  . Allopurinol Swelling  . Lasix [Furosemide]     Patient Measurements: Height: 6\' 2"  (188 cm) Weight: 168 lb 12.8 oz (76.6 kg) IBW/kg (Calculated) : 82.2 Heparin Dosing Weight: 76.6kg  Vital Signs: Temp: 97.9 F (36.6 C) (02/24 0719) Temp Source: Oral (02/24 0719) BP: 136/57 (02/24 0719) Pulse Rate: 64 (02/24 0719)  Labs: Recent Labs    01/26/2020 1114 01/15/2020 1656 02/06/2020 1847 02/01/2020 2042 01/17/2020 2254 02/03/20 0658  HGB 13.6  --   --   --   --  11.9*  HCT 42.4  --   --   --   --  36.8*  PLT 131*  --   --   --   --  108*  LABPROT  --   --   --   --   --  14.6  INR  --   --   --   --   --  1.2  CREATININE 1.92*  --   --   --   --  1.64*  TROPONINIHS 6   < > 7 7 8   --    < > = values in this interval not displayed.    Estimated Creatinine Clearance: 37 mL/min (A) (by C-G formula based on SCr of 1.64 mg/dL (H)).   Medical History: Past Medical History:  Diagnosis Date  . Asthma   . BPH (benign prostatic hyperplasia)   . COPD (chronic obstructive pulmonary disease) ([place])   . Diverticulosis   . GERD (gastroesophageal reflux disease)   . Gout   . Gout   . History of kidney stones   . Hypertension   . Hypothyroidism   . Klinefelter syndrome   . Nephrolithiasis   . [facility] spotted fever    history of....  . Shingles   . Sleep apnea    NO CPAP  . Stroke [facility])    TIA    Medications:  No pta anticoagulant of record.  Pt was on SQH prior to start of drip - last dose 5000 units sq @ 0512 0224.  Assessment: Pharmacy has been consulted to initiate and monitor heparin drip in this 86 yo male with elevated D-Dimer and leg swelling pending ultrasound.  Goal of Therapy:  Heparin level 0.3-0.7 units/ml Monitor platelets by anticoagulation  protocol: Yes   Plan:  CBC baseline obtained with am labs, will obtain baseline APTT and INR per protocol.  Give 2000 units bolus x 1 Start heparin infusion at 1250 units/hr Check anti-Xa level in 8 hours and daily while on heparin Continue to monitor H&H and platelets  Lu Duffel, PharmD, BCPS Clinical Pharmacist 02/03/2020 10:29 AM

## 2020-02-03 NOTE — Progress Notes (Signed)
Had alerted MD yesterday that patient's right leg is swollen, no order for ultrasound was placed. This AM alerted Dr. Priscella Mann that patient's leg is red and swollen and ddimer is very elevated. MD placed orders. Will continue to monitor patient closely. No distress.

## 2020-02-03 NOTE — Consult Note (Signed)
ANTICOAGULATION CONSULT NOTE - Initial Consult  Pharmacy Consult for heparin drip Indication: DVT/VTE suspected (elevated D-Dimer > 10,000 and leg swelling/warmth pending ultrasound)  Allergies  Allergen Reactions  . Allopurinol Swelling  . Lasix [Furosemide]     Patient Measurements: Height: 6\' 2"  (188 cm) Weight: 168 lb 12.8 oz (76.6 kg) IBW/kg (Calculated) : 82.2 Heparin Dosing Weight: 76.6kg  Vital Signs: Temp: 97.8 F (36.6 C) (02/24 1900) Temp Source: Oral (02/24 1900) BP: 159/73 (02/24 1900) Pulse Rate: 72 (02/24 1900)  Labs: Recent Labs    02/04/2020 1114 02/03/2020 1656 02/05/2020 1847 01/23/2020 2042 01/25/2020 2254 02/03/20 0658 02/03/20 1040 02/03/20 2036 02/03/20 2221  HGB 13.6  --   --   --   --  11.9*  --   --   --   HCT 42.4  --   --   --   --  36.8*  --   --   --   PLT 131*  --   --   --   --  108*  --   --   --   APTT  --   --   --   --   --   --  40*  --   --   LABPROT  --   --   --   --   --  14.6 14.2  --   --   INR  --   --   --   --   --  1.2 1.1  --   --   HEPARINUNFRC  --   --   --   --   --   --   --  1.68* 1.57*  CREATININE 1.92*  --   --   --   --  1.64*  --   --   --   TROPONINIHS 6   < > 7 7 8   --   --   --   --    < > = values in this interval not displayed.    Estimated Creatinine Clearance: 37 mL/min (A) (by C-G formula based on SCr of 1.64 mg/dL (H)).   Medical History: Past Medical History:  Diagnosis Date  . Asthma   . BPH (benign prostatic hyperplasia)   . COPD (chronic obstructive pulmonary disease) ([place])   . Diverticulosis   . GERD (gastroesophageal reflux disease)   . Gout   . Gout   . History of kidney stones   . Hypertension   . Hypothyroidism   . Klinefelter syndrome   . Nephrolithiasis   . [facility] spotted fever    history of....  . Shingles   . Sleep apnea    NO CPAP  . Stroke [facility])    TIA    Medications:  No pta anticoagulant of record.  Pt was on SQH prior to start of drip - last dose 5000 units sq  @ 0512 0224.  Assessment: Pharmacy has been consulted to initiate and monitor heparin drip in this 86 yo male with elevated D-Dimer and leg swelling pending ultrasound.  Goal of Therapy:  Heparin level 0.3-0.7 units/ml Monitor platelets by anticoagulation protocol: Yes   Plan:  02/24 @ 2300 HL 1.54 supratherapeutic x 2 will hold drip for 1 hour and restart at 0030 at 900 units/hr and will recheck HL at 0830, CBC trending down will continue to monitor, no bleeding issues per RN.  Tobie Lords, PharmD, BCPS Clinical Pharmacist 02/03/2020 11:27 PM

## 2020-02-03 NOTE — Progress Notes (Signed)
PA Stegmeyer requested that I obtain consent for Vascular surgery tomorrow from Richard Fry, Richard Fry. PA stated she had already explained procedure to patient and MPOA. Order placed, consent obtained. Both patient and MPOA stated the understand and consent. MPOA signed due to patient's periodic confusion.

## 2020-02-03 NOTE — Progress Notes (Signed)
Heparin drip started per MD orders. Colletta [place], charge nurse cosigned and witnessed correct rate. Safety/fall precautions are in place. Patient educated on bleeding precautions.

## 2020-02-03 NOTE — Consult Note (Signed)
[facility] Vascular Consult Note  MRN : [id number]  Richard Fry is a 86 y.o. (05/08/1934) male who presents with chief complaint of  Chief Complaint  Patient presents with  . Chest Pain   History of Present Illness:  The patient is an 86 year old male with multiple medical issues who presented to the [facility] emergency department via EMS with a chief complaint of chest pain.  The patient is a poor historian and is confused confused at baseline.  He is able to answer simple questions correctly however is not able to elaborate on his medical history.  Upon presentation to the emergency department, the patient noted he was having intermittent chest pain and abdominal pain.  He was able to localize his discomfort the right upper quadrant.  Patient with elevated liver enzymes.  Seen by gastroenterology.  During the examination, the patient was noted to have swelling to the right lower extremity.  A venous duplex was ordered and the patient was found to have an extensive occlusive DVT to the right lower extremity.  At this time, patient denies any right lower extremity discomfort or chest pain. Denies fever, nausea vomiting.  Denies shortness of breath or chest pain.  Vascular surgery was consulted by Dr. Priscella Mann for possible endovascular intervention.  Current Facility-Administered Medications  Medication Dose Route Frequency Provider Last Rate Last Admin  . [START ON 01/11/2020] 0.9 %  sodium chloride infusion   Intravenous Continuous Akire Rennert A, PA-C      . acetaminophen (TYLENOL) tablet 650 mg  650 mg Oral Q6H PRN Para Skeans, MD       Or  . acetaminophen (TYLENOL) suppository 650 mg  650 mg Rectal Q6H PRN Para Skeans, MD      . albuterol (PROVENTIL) (2.5 MG/3ML) 0.083% nebulizer solution 3 mL  3 mL Inhalation Q6H PRN Para Skeans, MD      . heparin ADULT infusion 100 units/mL (25000 units/253mL sodium chloride 0.45%)  1,250  Units/hr Intravenous Continuous Lu Duffel, RPH 12.5 mL/hr at 02/03/20 1201 1,250 Units/hr at 02/03/20 1201  . lactated ringers infusion   Intravenous Continuous Para Skeans, MD 100 mL/hr at 02/03/20 1522 New Bag at 02/03/20 1522  . levothyroxine (SYNTHROID) tablet 75 mcg  75 mcg Oral QAC breakfast Para Skeans, MD   75 mcg at 02/03/20 [id number]  . pantoprazole (PROTONIX) injection 40 mg  40 mg Intravenous Q12H Para Skeans, MD   40 mg at 02/03/20 [id number]   Past Medical History:  Diagnosis Date  . Asthma   . BPH (benign prostatic hyperplasia)   . COPD (chronic obstructive pulmonary disease) ([place])   . Diverticulosis   . GERD (gastroesophageal reflux disease)   . Gout   . Gout   . History of kidney stones   . Hypertension   . Hypothyroidism   . Klinefelter syndrome   . Nephrolithiasis   . [facility] spotted fever    history of....  . Shingles   . Sleep apnea    NO CPAP  . Stroke [facility])    TIA   Past Surgical History:  Procedure Laterality Date  . CATARACT EXTRACTION W/PHACO Left 08/13/2017   Procedure: CATARACT EXTRACTION PHACO AND INTRAOCULAR LENS PLACEMENT (IOC);  Surgeon: Birder Robson, MD;  Location: [facility] ORS;  Service: Ophthalmology;  Laterality: Left;  [place] 00:42.8 AP% 13.1 CDE 5.60 FLUID PACK LOT # [id number]  . CATARACT EXTRACTION W/PHACO Right 09/24/2017   Procedure: CATARACT  EXTRACTION PHACO AND INTRAOCULAR LENS PLACEMENT (IOC);  Surgeon: Birder Robson, MD;  Location: [facility] ORS;  Service: Ophthalmology;  Laterality: Right;  [place] 00:42 AP% 19.4 CDE 8.23 Fluid pack lot # [id number]  . JOINT REPLACEMENT     LEFT THR/  BIL TKR  . JOINT REPLACEMENT Bilateral    TKR   Social History Social History   Tobacco Use  . Smoking status: Former Smoker    Types: Cigarettes  . Smokeless tobacco: Never Used  Substance Use Topics  . Alcohol use: No  . Drug use: No   Family History History reviewed. No pertinent family history.  Denies family history of peripheral  artery disease, venous disease or renal  disease.  Allergies  Allergen Reactions  . Allopurinol Swelling  . Lasix [Furosemide]    REVIEW OF SYSTEMS (Negative unless checked)  Constitutional: [] Weight loss  [] Fever  [] Chills Cardiac: [] Chest pain   [] Chest pressure   [] Palpitations   [] Shortness of breath when laying flat   [] Shortness of breath at rest   [] Shortness of breath with exertion. Vascular:  [] Pain in legs with walking   [] Pain in legs at rest   [] Pain in legs when laying flat   [] Claudication   [] Pain in feet when walking  [] Pain in feet at rest  [] Pain in feet when laying flat   [] History of DVT   [] Phlebitis   [x] Swelling in legs   [] Varicose veins   [] Non-healing ulcers Pulmonary:   [] Uses home oxygen   [] Productive cough   [] Hemoptysis   [] Wheeze  [] COPD   [] Asthma Neurologic:  [] Dizziness  [] Blackouts   [] Seizures   [] History of stroke   [] History of TIA  [] Aphasia   [] Temporary blindness   [] Dysphagia   [] Weakness or numbness in arms   [] Weakness or numbness in legs Musculoskeletal:  [] Arthritis   [] Joint swelling   [] Joint pain   [] Low back pain Hematologic:  [] Easy bruising  [] Easy bleeding   [] Hypercoagulable state   [] Anemic  [] Hepatitis Gastrointestinal:  [] Blood in stool   [] Vomiting blood  [] Gastroesophageal reflux/heartburn   [] Difficulty swallowing. Genitourinary:  [] Chronic kidney disease   [] Difficult urination  [] Frequent urination  [] Burning with urination   [] Blood in urine Skin:  [] Rashes   [] Ulcers   [] Wounds Psychological:  [] History of anxiety   []  History of major depression.   Physical Examination  Vitals:   02/03/20 0518 02/03/20 0719 02/03/20 1148 02/03/20 1500  BP: 98/76 (!) 136/57 122/63 139/71  Pulse: 66 64 64 65  Resp: 18 18 18 18   Temp: 98.2 F (36.8 C) 97.9 F (36.6 C) 97.9 F (36.6 C) 98 F (36.7 C)  TempSrc: Oral Oral Oral   SpO2: 97% 98% 99% 98%  Weight:      Height:       Body mass index is 21.67 kg/m. Gen:  WD/WN, NAD Head:  [place]/AT, No temporalis wasting. Prominent temp pulse not noted. Ear/Nose/Throat: Hearing grossly intact, nares w/o erythema or drainage, oropharynx w/o Erythema/Exudate Eyes: Sclera non-icteric, conjunctiva clear Neck: Trachea midline.  No JVD.  Pulmonary:  Good air movement, respirations not labored, equal bilaterally.  Cardiac: RRR, normal S1, S2. Vascular:  Vessel Right Left  Radial Palpable Palpable  Ulnar Palpable Palpable  Brachial Palpable Palpable  Carotid Palpable, without bruit Palpable, without bruit  Aorta Not palpable N/A  Femoral Palpable Palpable  Popliteal Palpable Palpable  PT Palpable Palpable  DP Palpable Palpable   Right Lower Extremity: Thigh soft.  Calf soft.  Mild  edema.  Nontender to palpation.  No acute vascular compromise to extremity at this time.  Motor/sensory is intact.  Gastrointestinal: soft, non-tender/non-distended. No guarding/reflex.  Musculoskeletal: M/S 5/5 throughout.  Extremities without ischemic changes.  No deformity or atrophy. Mild edema. Neurologic: Sensation grossly intact in extremities.  Symmetrical.  Speech is fluent. Motor exam as listed above. Psychiatric: Judgment intact, Mood & affect appropriate for pt's clinical situation. Dermatologic: No rashes or ulcers noted.  No cellulitis or open wounds. Lymph : No Cervical, Axillary, or Inguinal lymphadenopathy.  CBC Lab Results  Component Value Date   WBC 5.6 02/03/2020   HGB 11.9 (L) 02/03/2020   HCT 36.8 (L) 02/03/2020   MCV 91.1 02/03/2020   PLT 108 (L) 02/03/2020   BMET    Component Value Date/Time   NA 141 02/03/2020 0658   K 3.9 02/03/2020 0658   CL 110 02/03/2020 0658   CO2 24 02/03/2020 0658   GLUCOSE 98 02/03/2020 0658   BUN 36 (H) 02/03/2020 0658   CREATININE 1.64 (H) 02/03/2020 0658   CALCIUM 9.1 02/03/2020 0658   GFRNONAA 38 (L) 02/03/2020 0658   GFRAA 44 (L) 02/03/2020 [id number]   Estimated Creatinine Clearance: 37 mL/min (A) (by C-G formula based on SCr of 1.64  mg/dL (H)).  COAG Lab Results  Component Value Date   INR 1.1 02/03/2020   INR 1.2 02/03/2020   INR 1.0 09/23/2012   Radiology DG Skull 1-3 Views  Result Date: 01/12/2020 CLINICAL DATA:  Screening for foreign body for MRI EXAM: SKULL - 1-3 VIEW COMPARISON:  None. FINDINGS: No intracranial foreign body. No orbital foreign body. No acute skeletal abnormality. IMPRESSION: Negative for intracranial or orbital foreign body. Electronically Signed   By: Franchot Gallo M.D.   On: 02/05/2020 15:01   DG Pelvis 1-2 Views  Result Date: 01/24/2020 CLINICAL DATA:  MRI clearance EXAM: PELVIS - 1-2 VIEW COMPARISON:  None. FINDINGS: Left hip replacement in satisfactory position. No other foreign body. Mild degenerative change right hip.  No acute skeletal abnormality. IMPRESSION: Left hip replacement.  No contraindication to MRI in the pelvis. Electronically Signed   By: Franchot Gallo M.D.   On: 02/01/2020 15:02   CT HEAD WO CONTRAST  Result Date: 01/18/2020 CLINICAL DATA:  Encephalopathy EXAM: CT HEAD WITHOUT CONTRAST TECHNIQUE: Contiguous axial images were obtained from the base of the skull through the vertex without intravenous contrast. COMPARISON:  08/10/2015 FINDINGS: Brain: No evidence of acute infarction, hemorrhage, hydrocephalus, extra-axial collection or mass lesion/mass effect. Extensive low-density changes within the periventricular and subcortical white matter compatible with chronic microvascular ischemic change. Moderate diffuse cerebral volume loss. Vascular: No hyperdense vessel or unexpected calcification. Skull: Normal. Negative for fracture or focal lesion. Sinuses/Orbits: No acute finding. Other: None. IMPRESSION: 1.  No acute intracranial findings. 2.  Chronic microvascular ischemic change and cerebral volume loss. Electronically Signed   By: Davina Poke D.O.   On: 01/17/2020 16:28   MR ABDOMEN MRCP WO CONTRAST  Result Date: 01/29/2020 CLINICAL DATA:  Cholelithiasis chest pain  EXAM: MRI ABDOMEN WITHOUT CONTRAST  (INCLUDING MRCP) TECHNIQUE: Multiplanar multisequence MR imaging of the abdomen was performed. Heavily T2-weighted images of the biliary and pancreatic ducts were obtained, and three-dimensional MRCP images were rendered by post processing. COMPARISON:  Ultrasound 01/16/2020 FINDINGS: Lower chest: Multiple sequences are degraded by respiratory motion. No acute findings. Hepatobiliary: No focal hepatic abnormality. Sludge and multiple small stones within the gallbladder. No significant intra hepatic biliary dilatation. Slightly enlarged extrahepatic common bile duct  measuring up to 8 mm. Multiple hypointense filling defects within the mid and distal common bile duct measuring up to 7 mm in size, for example series 15, image number 16, and series 2 image number 15 and 16. Pancreas: No mass, inflammatory changes, or other parenchymal abnormality identified. Spleen:  Accessory splenule.  Spleen otherwise unremarkable Adrenals/Urinary Tract: Adrenal glands are within normal limits. Multiple cysts within the bilateral kidneys. On the right, cysts measure up to 3.7 cm. On the left, cysts measure up to 8.7 cm. No hydronephrosis. Stomach/Bowel: Visualized portions within the abdomen are unremarkable. Vascular/Lymphatic: No pathologically enlarged lymph nodes identified. No abdominal aortic aneurysm demonstrated. Other:  No ascites in the upper abdomen Musculoskeletal: No suspicious bone lesions identified. IMPRESSION: 1. Sludge and multiple small stones within the gallbladder. No definitive right upper quadrant inflammatory changes. 2. Choledocholithiasis with slightly enlarged extrahepatic common bile duct up to 8 mm. 3. Multiple renal cysts Electronically Signed   By: Donavan Foil M.D.   On: 01/26/2020 23:12   MR 3D Recon At Scanner  Result Date: 01/29/2020 CLINICAL DATA:  Cholelithiasis chest pain EXAM: MRI ABDOMEN WITHOUT CONTRAST  (INCLUDING MRCP) TECHNIQUE: Multiplanar  multisequence MR imaging of the abdomen was performed. Heavily T2-weighted images of the biliary and pancreatic ducts were obtained, and three-dimensional MRCP images were rendered by post processing. COMPARISON:  Ultrasound 02/03/2020 FINDINGS: Lower chest: Multiple sequences are degraded by respiratory motion. No acute findings. Hepatobiliary: No focal hepatic abnormality. Sludge and multiple small stones within the gallbladder. No significant intra hepatic biliary dilatation. Slightly enlarged extrahepatic common bile duct measuring up to 8 mm. Multiple hypointense filling defects within the mid and distal common bile duct measuring up to 7 mm in size, for example series 15, image number 16, and series 2 image number 15 and 16. Pancreas: No mass, inflammatory changes, or other parenchymal abnormality identified. Spleen:  Accessory splenule.  Spleen otherwise unremarkable Adrenals/Urinary Tract: Adrenal glands are within normal limits. Multiple cysts within the bilateral kidneys. On the right, cysts measure up to 3.7 cm. On the left, cysts measure up to 8.7 cm. No hydronephrosis. Stomach/Bowel: Visualized portions within the abdomen are unremarkable. Vascular/Lymphatic: No pathologically enlarged lymph nodes identified. No abdominal aortic aneurysm demonstrated. Other:  No ascites in the upper abdomen Musculoskeletal: No suspicious bone lesions identified. IMPRESSION: 1. Sludge and multiple small stones within the gallbladder. No definitive right upper quadrant inflammatory changes. 2. Choledocholithiasis with slightly enlarged extrahepatic common bile duct up to 8 mm. 3. Multiple renal cysts Electronically Signed   By: Donavan Foil M.D.   On: 01/18/2020 23:12   US Venous Img Lower Unilateral Right (DVT)  Result Date: 02/03/2020 CLINICAL DATA:  Right lower extremity pain for the past several days. History of smoking. Evaluate for DVT. EXAM: RIGHT LOWER EXTREMITY VENOUS DOPPLER ULTRASOUND TECHNIQUE: Gray-scale  sonography with graded compression, as well as color Doppler and duplex ultrasound were performed to evaluate the lower extremity deep venous systems from the level of the common femoral vein and including the common femoral, femoral, profunda femoral, popliteal and calf veins including the posterior tibial, peroneal and gastrocnemius veins when visible. The superficial great saphenous vein was also interrogated. Spectral Doppler was utilized to evaluate flow at rest and with distal augmentation maneuvers in the common femoral, femoral and popliteal veins. COMPARISON:  None. FINDINGS: Contralateral Common Femoral Vein: Respiratory phasicity is normal and symmetric with the symptomatic side. No evidence of thrombus. Normal compressibility. The examination is positive for extensive occlusive DVT extending  from the right common femoral vein (image 5) into the imaged portions of the right deep femoral vein (image 13). Nonocclusive thrombus is seen within the saphenofemoral junction (image 11). There is hypoechoic occlusive thrombus involving the proximal (image 18), mid (image 21) and distal (image 26) aspects of the right femoral vein, extending to involve the popliteal vein (image 31). Nonocclusive thrombus seen within the saphenofemoral junction (image 11). Hypoechoic occlusive thrombus involving both paired posterior tibial (image 39) and peroneal veins (image 42). IMPRESSION: The examination is positive for extensive occlusive DVT involving near the entirety of the right lower extremity venous system Electronically Signed   By: Sandi Mariscal M.D.   On: 02/03/2020 11:55   DG Abdomen Acute W/Chest  Result Date: 01/31/2020 CLINICAL DATA:  Chest and abdominal pain EXAM: DG ABDOMEN ACUTE W/ 1V CHEST COMPARISON:  05/23/2011 chest radiograph. FINDINGS: Stable cardiomediastinal silhouette with normal heart size. No pneumothorax. No pleural effusion. Lungs appear clear, with no acute consolidative airspace disease and no  pulmonary edema. Healed deformities in multiple posterior right mid ribs. No disproportionately dilated small bowel loops or air-fluid levels. No evidence of pneumatosis or pneumoperitoneum. No radiopaque nephrolithiasis. Partially visualized left total hip arthroplasty. Marked lumbar spondylosis. IMPRESSION: 1. No active disease in the chest. 2. Nonobstructive bowel gas pattern. Electronically Signed   By: Ilona Sorrel M.D.   On: 01/23/2020 11:52   US ABDOMEN LIMITED RUQ  Result Date: 01/19/2020 CLINICAL DATA:  Abdominal pain EXAM: ULTRASOUND ABDOMEN LIMITED RIGHT UPPER QUADRANT COMPARISON:  01/28/2020 FINDINGS: Gallbladder: Sludge and multiple small calculi measuring up to 4 mm are again seen. No wall thickening or pericholecystic fluid visualized. No sonographic Murphy sign noted by sonographer. Common bile duct: Diameter: Measures up to 8 mm, increased since the prior study. Liver: No focal lesion identified. Within normal limits in parenchymal echogenicity. Portal vein is patent on color Doppler imaging with normal direction of blood flow towards the liver. Other: None. IMPRESSION: Gallbladder sludge and small calculi as before. No sonographic evidence of acute cholecystitis. Increase in size of common bile duct. Distal obstruction is not excluded. Electronically Signed   By: Macy Mis M.D.   On: 01/30/2020 12:14   US Abdomen Limited RUQ  Result Date: 01/28/2020 CLINICAL DATA:  Elevated liver function tests. EXAM: ULTRASOUND ABDOMEN LIMITED RIGHT UPPER QUADRANT COMPARISON:  06/16/2013 FINDINGS: Gallbladder: Gallbladder sludge is noted as well as multiple tiny less than 5 mm gallstones. No evidence of gallbladder wall thickening or pericholecystic fluid. No sonographic Murphy sign noted by sonographer. Common bile duct: Diameter: 4 mm, within normal limits. Liver: No focal lesion identified. Within normal limits in parenchymal echogenicity. Portal vein is patent on color Doppler imaging with normal  direction of blood flow towards the liver. Other: None. IMPRESSION: Cholelithiasis. No sonographic signs of cholecystitis or biliary ductal dilatation. Unremarkable sonographic appearance of liver. Electronically Signed   By: Marlaine Hind M.D.   On: 01/28/2020 11:10   Assessment/Plan The patient is an 86 year old male with multiple medical issues who presented to the [facility] emergency department via EMS with a chief complaint of chest pain found to have a extensive occlusive DVT to the right lower extremity.  1. Extensive Right Lower Extremity DVT: Patient was found to have an extensive right lower extremity DVT on duplex.  Due to the extensive nature of his DVT, would recommend venous thrombectomy / venous lysis in an attempt to lessen the clot burden, decrease the chance of a pulmonary embolus and  reduce postphlebitic symptoms.  The patient's POA is Alphonzo Dublin, who I spoke to her via phone [phone] on 02/03/20 and received verbal consent.  Procedure, risks and benefits explained to both the POA and the patient.  All questions were answered.  Both the patient and the POA gave consent.  We will plan on this tomorrow with Dr. Lucky Cowboy. Continue heparin at this time.   2. Anticoagulation: Continue IV heparin drip at this time. Patient will need to be transitioned over to Eliquis 5 mg PO twice daily for at least 6 months. We will continue to follow his DVT in the outpatient setting.  3. Hypertension: On appropriate medications.  Encouraged good control as its slows the progression of atherosclerotic disease  Discussed with Dr. Mayme Genta, PA-C  02/03/2020 3:35 PM  This note was created with Dragon medical transcription system.  Any error is purely unintentional

## 2020-02-03 NOTE — Progress Notes (Signed)
Md stated he would dc order for sitter or tele monitoring. Patient is relaxed and redirectable. Will monitor patient closely.

## 2020-02-03 NOTE — Progress Notes (Signed)
PROGRESS NOTE    Richard Fry  [id number] DOB: [birth year] DOA: 02/01/2020 PCP: Tracie Harrier, MD   Brief Narrative:  Richard Fry is a 86 y.o. male with medical history significant of htn,hypothyroidism,RMSF seen in ed for chest pain, which pt then points to ruq pain. HPIis per edmd note pt is altered with sitter at bedside, he is delirius and says he lives in [place]. Per caretaker, at PCP appt few weeks ago it showed liver enzymes were high in the 300's and had usg was done and repeat usg today shows dilated CBD.   2/24: Patient seen and examined.  Sitter at bedside.  Patient appears alert and is answering all questions appropriately.  MRCP findings noted.  Reached out to gastroenterology.  Currently no emergent indication for ERCP as LFTs are trending down patient is nontoxic and noncholangitic in appearance.  Markedly elevated D-dimer noted.  Right lower extremity Doppler ordered.  Patient has extensive clot burden in nearly all the vasculature within the right lower extremity.   Assessment & Plan:   Principal Problem:   Chest pain Active Problems:   COPD (chronic obstructive pulmonary disease) (HCC)   Hypothyroidism   Hypertension  Acute lower extremity DVT Patient had marked elevated D-dimer Right lower extremity duplex confirms extensive acute DVT Heparin GTT initiated Vascular surgery consult called for evaluation for surgical lobectomy  Acute choledocholithiasis Patient has stone in common bile duct with 8 mm CBD on MRCP GI consulted, currently no emergent indication for ERCP and there is no ERCP availability at [facility] this week Will defer endoscopic evaluation for now given presence of acute DVT and need for therapeutic anticoagulation Monitor daily liver function If patient develops any signs of acute cholangitis will likely require transfer to [facility] for advanced endoscopic procedure Defer antibiotics for now  Hypothyroidism Continue home  Synthroid  Hypertension Currently on no medications Pressure controlled Vitals per unit protocol  COPD Stable, no evidence of exacerbation Will continue home regimen of albuterol and Singulair   DVT prophylaxis: Heparin GTT Code Status: DNR Family Communication: Spoke with caregiver/MPOA phone, 02/03/2020 Disposition Plan: Unclear at this time, home with home health versus skilled nursing facility  Consultants:   [place]  Procedures:  none  Antimicrobials:   none   Subjective: Seen and examined Still endorsing some right upper quadrant pain Nontoxic in appearance Alert oriented x2  Objective: Vitals:   02/03/20 0003 02/03/20 0518 02/03/20 0719 02/03/20 1148  BP: 130/83 98/76 (!) 136/57 122/63  Pulse: 72 66 64 64  Resp: 18 18 18 18   Temp: 98.3 F (36.8 C) 98.2 F (36.8 C) 97.9 F (36.6 C) 97.9 F (36.6 C)  TempSrc: Oral Oral Oral Oral  SpO2: 97% 97% 98% 99%  Weight:      Height:        Intake/Output Summary (Last 24 hours) at 02/03/2020 1420 Last data filed at 01/18/2020 1800 Gross per 24 hour  Intake 33.45 ml  Output 25 ml  Net 8.45 ml   Filed Weights   01/15/2020 1134 01/26/2020 1629  Weight: 77.1 kg 76.6 kg    Examination:  General exam: Appears calm and comfortable  Respiratory system: Clear to auscultation. Respiratory effort normal. Cardiovascular system: S1 & S2 heard, RRR. No JVD, murmurs, rubs, gallops or clicks. No pedal edema. Gastrointestinal system: Tender to palpation right upper quadrant, none distended, normal bowel sounds Central nervous system: Alert and oriented. No focal neurological deficits. Extremities: Symmetric 5 x 5 power. Skin: No rashes, lesions  or ulcers Psychiatry: Judgement and insight appear normal. Mood & affect appropriate.     Data Reviewed: I have personally reviewed following labs and imaging studies  CBC: Recent Labs  Lab 02/06/2020 1114 02/03/20 0658  WBC 7.8 5.6  NEUTROABS  --  3.7  HGB 13.6 11.9*   HCT 42.4 36.8*  MCV 92.4 91.1  PLT 131* [id number]*   Basic Metabolic Panel: Recent Labs  Lab 01/26/2020 1114 02/03/20 0658  NA 140 141  K 4.9 3.9  CL 107 110  CO2 22 24  GLUCOSE 106* 98  BUN 40* 36*  CREATININE 1.92* 1.64*  CALCIUM 9.5 9.1  MG  --  2.0   GFR: Estimated Creatinine Clearance: 37 mL/min (A) (by C-G formula based on SCr of 1.64 mg/dL (H)). Liver Function Tests: Recent Labs  Lab 01/20/2020 1114 02/03/20 0658  AST 39 24  ALT 27 19  ALKPHOS 68 57  BILITOT 1.6* 1.2  PROT 7.9 7.1  ALBUMIN 4.3 3.6   Recent Labs  Lab 02/03/20 0658  LIPASE 40   Recent Labs  Lab 02/03/20 0658  AMMONIA 25   Coagulation Profile: Recent Labs  Lab 02/03/20 0658 02/03/20 1040  INR 1.2 1.1   Cardiac Enzymes: No results for input(s): CKTOTAL, CKMB, CKMBINDEX, TROPONINI in the last 168 hours. BNP (last 3 results) No results for input(s): PROBNP in the last 8760 hours. HbA1C: Recent Labs    01/18/2020 1656  HGBA1C 5.4   CBG: No results for input(s): GLUCAP in the last 168 hours. Lipid Profile: Recent Labs    01/13/2020 1656  CHOL 180  HDL 39*  LDLCALC 122*  TRIG 93  CHOLHDL 4.6   Thyroid Function Tests: Recent Labs    01/19/2020 1656  TSH 0.336*  FREET4 1.36*   Anemia Panel: No results for input(s): VITAMINB12, FOLATE, FERRITIN, TIBC, IRON, RETICCTPCT in the last 72 hours. Sepsis Labs: No results for input(s): PROCALCITON, LATICACIDVEN in the last 168 hours.  Recent Results (from the past 240 hour(s))  SARS CORONAVIRUS 2 (TAT 6-24 HRS) Nasopharyngeal Nasopharyngeal Swab     Status: None   Collection Time: 01/19/2020  4:03 PM   Specimen: Nasopharyngeal Swab  Result Value Ref Range Status   SARS Coronavirus 2 NEGATIVE NEGATIVE Final    Comment: (NOTE) SARS-CoV-2 target nucleic acids are NOT DETECTED. The SARS-CoV-2 RNA is generally detectable in upper and lower respiratory specimens during the acute phase of infection. Negative results do not preclude SARS-CoV-2  infection, do not rule out co-infections with other pathogens, and should not be used as the sole basis for treatment or other patient management decisions. Negative results must be combined with clinical observations, patient history, and epidemiological information. The expected result is Negative. Fact Sheet for Patients: SugarRoll.be Fact Sheet for Healthcare Providers: https://www.woods-mathews.com/ This test is not yet approved or cleared by the [place] FDA and  has been authorized for detection and/or diagnosis of SARS-CoV-2 by FDA under an Emergency Use Authorization (EUA). This EUA will remain  in effect (meaning this test can be used) for the duration of the COVID-19 declaration under Section 56 4(b)(1) of the Act, 21 U.S.C. section 360bbb-3(b)(1), unless the authorization is terminated or revoked sooner. Performed at [facility], [place] [address], [place], [place] [address]   Culture, blood (Routine X 2) w Reflex to ID Panel     Status: None (Preliminary result)   Collection Time: 01/31/2020  4:56 PM   Specimen: BLOOD  Result Value Ref Range Status  Specimen Description BLOOD BLOOD RIGHT HAND  Final   Special Requests   Final    BOTTLES DRAWN AEROBIC AND ANAEROBIC Blood Culture adequate volume   Culture   Final    NO GROWTH < 24 HOURS Performed at [facility], [address], [place], [place] [address]    Report Status PENDING  Incomplete  Culture, blood (Routine X 2) w Reflex to ID Panel     Status: None (Preliminary result)   Collection Time: 02/06/2020  4:56 PM   Specimen: BLOOD  Result Value Ref Range Status   Specimen Description BLOOD BLOOD LEFT HAND  Final   Special Requests   Final    BOTTLES DRAWN AEROBIC AND ANAEROBIC Blood Culture adequate volume   Culture   Final    NO GROWTH < 24 HOURS Performed at [facility], [address], [place], [place] [address]    Report Status PENDING   Incomplete         Radiology Studies: DG Skull 1-3 Views  Result Date: 01/29/2020 CLINICAL DATA:  Screening for foreign body for MRI EXAM: SKULL - 1-3 VIEW COMPARISON:  None. FINDINGS: No intracranial foreign body. No orbital foreign body. No acute skeletal abnormality. IMPRESSION: Negative for intracranial or orbital foreign body. Electronically Signed   By: Franchot Gallo M.D.   On: 01/21/2020 15:01   DG Pelvis 1-2 Views  Result Date: 02/06/2020 CLINICAL DATA:  MRI clearance EXAM: PELVIS - 1-2 VIEW COMPARISON:  None. FINDINGS: Left hip replacement in satisfactory position. No other foreign body. Mild degenerative change right hip.  No acute skeletal abnormality. IMPRESSION: Left hip replacement.  No contraindication to MRI in the pelvis. Electronically Signed   By: Franchot Gallo M.D.   On: 01/26/2020 15:02   CT HEAD WO CONTRAST  Result Date: 01/12/2020 CLINICAL DATA:  Encephalopathy EXAM: CT HEAD WITHOUT CONTRAST TECHNIQUE: Contiguous axial images were obtained from the base of the skull through the vertex without intravenous contrast. COMPARISON:  08/10/2015 FINDINGS: Brain: No evidence of acute infarction, hemorrhage, hydrocephalus, extra-axial collection or mass lesion/mass effect. Extensive low-density changes within the periventricular and subcortical white matter compatible with chronic microvascular ischemic change. Moderate diffuse cerebral volume loss. Vascular: No hyperdense vessel or unexpected calcification. Skull: Normal. Negative for fracture or focal lesion. Sinuses/Orbits: No acute finding. Other: None. IMPRESSION: 1.  No acute intracranial findings. 2.  Chronic microvascular ischemic change and cerebral volume loss. Electronically Signed   By: Davina Poke D.O.   On: 01/28/2020 16:28   MR ABDOMEN MRCP WO CONTRAST  Result Date: 02/06/2020 CLINICAL DATA:  Cholelithiasis chest pain EXAM: MRI ABDOMEN WITHOUT CONTRAST  (INCLUDING MRCP) TECHNIQUE: Multiplanar multisequence MR  imaging of the abdomen was performed. Heavily T2-weighted images of the biliary and pancreatic ducts were obtained, and three-dimensional MRCP images were rendered by post processing. COMPARISON:  Ultrasound 02/06/2020 FINDINGS: Lower chest: Multiple sequences are degraded by respiratory motion. No acute findings. Hepatobiliary: No focal hepatic abnormality. Sludge and multiple small stones within the gallbladder. No significant intra hepatic biliary dilatation. Slightly enlarged extrahepatic common bile duct measuring up to 8 mm. Multiple hypointense filling defects within the mid and distal common bile duct measuring up to 7 mm in size, for example series 15, image number 16, and series 2 image number 15 and 16. Pancreas: No mass, inflammatory changes, or other parenchymal abnormality identified. Spleen:  Accessory splenule.  Spleen otherwise unremarkable Adrenals/Urinary Tract: Adrenal glands are within normal limits. Multiple cysts within the bilateral kidneys. On  the right, cysts measure up to 3.7 cm. On the left, cysts measure up to 8.7 cm. No hydronephrosis. Stomach/Bowel: Visualized portions within the abdomen are unremarkable. Vascular/Lymphatic: No pathologically enlarged lymph nodes identified. No abdominal aortic aneurysm demonstrated. Other:  No ascites in the upper abdomen Musculoskeletal: No suspicious bone lesions identified. IMPRESSION: 1. Sludge and multiple small stones within the gallbladder. No definitive right upper quadrant inflammatory changes. 2. Choledocholithiasis with slightly enlarged extrahepatic common bile duct up to 8 mm. 3. Multiple renal cysts Electronically Signed   By: Donavan Foil M.D.   On: 01/13/2020 23:12   MR 3D Recon At Scanner  Result Date: 01/23/2020 CLINICAL DATA:  Cholelithiasis chest pain EXAM: MRI ABDOMEN WITHOUT CONTRAST  (INCLUDING MRCP) TECHNIQUE: Multiplanar multisequence MR imaging of the abdomen was performed. Heavily T2-weighted images of the biliary and  pancreatic ducts were obtained, and three-dimensional MRCP images were rendered by post processing. COMPARISON:  Ultrasound 01/16/2020 FINDINGS: Lower chest: Multiple sequences are degraded by respiratory motion. No acute findings. Hepatobiliary: No focal hepatic abnormality. Sludge and multiple small stones within the gallbladder. No significant intra hepatic biliary dilatation. Slightly enlarged extrahepatic common bile duct measuring up to 8 mm. Multiple hypointense filling defects within the mid and distal common bile duct measuring up to 7 mm in size, for example series 15, image number 16, and series 2 image number 15 and 16. Pancreas: No mass, inflammatory changes, or other parenchymal abnormality identified. Spleen:  Accessory splenule.  Spleen otherwise unremarkable Adrenals/Urinary Tract: Adrenal glands are within normal limits. Multiple cysts within the bilateral kidneys. On the right, cysts measure up to 3.7 cm. On the left, cysts measure up to 8.7 cm. No hydronephrosis. Stomach/Bowel: Visualized portions within the abdomen are unremarkable. Vascular/Lymphatic: No pathologically enlarged lymph nodes identified. No abdominal aortic aneurysm demonstrated. Other:  No ascites in the upper abdomen Musculoskeletal: No suspicious bone lesions identified. IMPRESSION: 1. Sludge and multiple small stones within the gallbladder. No definitive right upper quadrant inflammatory changes. 2. Choledocholithiasis with slightly enlarged extrahepatic common bile duct up to 8 mm. 3. Multiple renal cysts Electronically Signed   By: Donavan Foil M.D.   On: 01/18/2020 23:12   US Venous Img Lower Unilateral Right (DVT)  Result Date: 02/03/2020 CLINICAL DATA:  Right lower extremity pain for the past several days. History of smoking. Evaluate for DVT. EXAM: RIGHT LOWER EXTREMITY VENOUS DOPPLER ULTRASOUND TECHNIQUE: Gray-scale sonography with graded compression, as well as color Doppler and duplex ultrasound were performed to  evaluate the lower extremity deep venous systems from the level of the common femoral vein and including the common femoral, femoral, profunda femoral, popliteal and calf veins including the posterior tibial, peroneal and gastrocnemius veins when visible. The superficial great saphenous vein was also interrogated. Spectral Doppler was utilized to evaluate flow at rest and with distal augmentation maneuvers in the common femoral, femoral and popliteal veins. COMPARISON:  None. FINDINGS: Contralateral Common Femoral Vein: Respiratory phasicity is normal and symmetric with the symptomatic side. No evidence of thrombus. Normal compressibility. The examination is positive for extensive occlusive DVT extending from the right common femoral vein (image 5) into the imaged portions of the right deep femoral vein (image 13). Nonocclusive thrombus is seen within the saphenofemoral junction (image 11). There is hypoechoic occlusive thrombus involving the proximal (image 18), mid (image 21) and distal (image 26) aspects of the right femoral vein, extending to involve the popliteal vein (image 31). Nonocclusive thrombus seen within the saphenofemoral junction (image 11). Hypoechoic occlusive  thrombus involving both paired posterior tibial (image 39) and peroneal veins (image 42). IMPRESSION: The examination is positive for extensive occlusive DVT involving near the entirety of the right lower extremity venous system Electronically Signed   By: Sandi Mariscal M.D.   On: 02/03/2020 11:55   DG Abdomen Acute W/Chest  Result Date: 01/16/2020 CLINICAL DATA:  Chest and abdominal pain EXAM: DG ABDOMEN ACUTE W/ 1V CHEST COMPARISON:  05/23/2011 chest radiograph. FINDINGS: Stable cardiomediastinal silhouette with normal heart size. No pneumothorax. No pleural effusion. Lungs appear clear, with no acute consolidative airspace disease and no pulmonary edema. Healed deformities in multiple posterior right mid ribs. No disproportionately  dilated small bowel loops or air-fluid levels. No evidence of pneumatosis or pneumoperitoneum. No radiopaque nephrolithiasis. Partially visualized left total hip arthroplasty. Marked lumbar spondylosis. IMPRESSION: 1. No active disease in the chest. 2. Nonobstructive bowel gas pattern. Electronically Signed   By: Ilona Sorrel M.D.   On: 02/01/2020 11:52   US ABDOMEN LIMITED RUQ  Result Date: 01/25/2020 CLINICAL DATA:  Abdominal pain EXAM: ULTRASOUND ABDOMEN LIMITED RIGHT UPPER QUADRANT COMPARISON:  01/28/2020 FINDINGS: Gallbladder: Sludge and multiple small calculi measuring up to 4 mm are again seen. No wall thickening or pericholecystic fluid visualized. No sonographic Murphy sign noted by sonographer. Common bile duct: Diameter: Measures up to 8 mm, increased since the prior study. Liver: No focal lesion identified. Within normal limits in parenchymal echogenicity. Portal vein is patent on color Doppler imaging with normal direction of blood flow towards the liver. Other: None. IMPRESSION: Gallbladder sludge and small calculi as before. No sonographic evidence of acute cholecystitis. Increase in size of common bile duct. Distal obstruction is not excluded. Electronically Signed   By: Macy Mis M.D.   On: 02/06/2020 12:14        Scheduled Meds: . levothyroxine  75 mcg Oral QAC breakfast  . pantoprazole (PROTONIX) IV  40 mg Intravenous Q12H   Continuous Infusions: . heparin 1,250 Units/hr (02/03/20 1201)  . lactated ringers 100 mL/hr at 02/03/20 1310     LOS: 1 day    Time spent: 35 minutes    Sidney Ace, MD [facility] Pager [phone]  If 7PM-7AM, please contact night-coverage 02/03/2020, 2:20 PM

## 2020-02-03 NOTE — Progress Notes (Signed)
Patient had momentary confusion and ripped out both IVs. Patient calmed down using therapuetic communication. Both Iv's removed. Patient tolerated well. All safety measures are in place. Patient reoriented. Bed alarm on. Close to nurses station. Mats on floor.

## 2020-02-03 NOTE — Consult Note (Signed)
GI Inpatient Consult Note  Reason for Consult: Choledocholithiasis    Attending Requesting Consult: Dr. Ralene Muskrat, MD  History of Present Illness: Richard Fry is a 86 y.o. male seen for evaluation of RUQ abd pain, choledocholithiasis at the request of Dr. Priscella Mann. Pt has a PMH of HTN, hypothyroidism, RMSF who presented to the ED yesterday for RUQ abd pain and non-specific chest pain. He was delirius and unable to provide much historical information. Upon arrival to the ED, labs showed WBC 7.8, AST 39, ALT 27, tbili 1.6, alk phos 68, GGT elevated at 79. RUQ US showed gallbladder sludge and small calculi with no sonographic evidence of acute cholecystitis. However, there was increase in size of CBD to 8 mm and subsequent MRCP showed choledocholithiasis with slightly enlarged extrahepatic common bile duct stone up to 8 mm, sludge and multiple small stones within the gallbladder with no RUQ inflammatory changes. GI was consulted in the evaluation of choledocholithiasis. Of note, he was seen in outpatient setting by Dr. Ginette Pitman and labs on 01/22/2020 showed transaminitis with AST 309, ALT 300, alk phos 190, and tbili 1.9. RUQ [place] 02/18 showed cholelithiasis with no evidence of cholecystitis or biliary ductal dilatation. Patient is afebrile. He was seen today and is still delirious. LFTs rechecked today and were completely normal with resolution of hyperbilirubinemia. He denies any nausea, vomiting, chills, dyspepsia, diarrhea, constipation, or rectal bleeding. He reports mild abdominal pain located in RUQ if he presses deeply into his abdomen. He was found by attending to have right leg erythema and swelling and subsequent US showed extensive occlusive DVT involving the entirety of the right lower extremity venous system. He was started on heparin.    Last EGD/Colonoscopy: Unable to obtain   Past Medical History:  Past Medical History:  Diagnosis Date  . Asthma   . BPH (benign prostatic hyperplasia)    . COPD (chronic obstructive pulmonary disease) ([place])   . Diverticulosis   . GERD (gastroesophageal reflux disease)   . Gout   . Gout   . History of kidney stones   . Hypertension   . Hypothyroidism   . Klinefelter syndrome   . Nephrolithiasis   . [facility] spotted fever    history of....  . Shingles   . Sleep apnea    NO CPAP  . Stroke [facility])    TIA    Problem List: Patient Active Problem List   Diagnosis Date Noted  . COPD (chronic obstructive pulmonary disease) ([place])   . Hypothyroidism   . Hypertension   . Chest pain     Past Surgical History: Past Surgical History:  Procedure Laterality Date  . CATARACT EXTRACTION W/PHACO Left 08/13/2017   Procedure: CATARACT EXTRACTION PHACO AND INTRAOCULAR LENS PLACEMENT (IOC);  Surgeon: Birder Robson, MD;  Location: [facility] ORS;  Service: Ophthalmology;  Laterality: Left;  [place] 00:42.8 AP% 13.1 CDE 5.60 FLUID PACK LOT # [id number]  . CATARACT EXTRACTION W/PHACO Right 09/24/2017   Procedure: CATARACT EXTRACTION PHACO AND INTRAOCULAR LENS PLACEMENT (IOC);  Surgeon: Birder Robson, MD;  Location: [facility] ORS;  Service: Ophthalmology;  Laterality: Right;  [place] 00:42 AP% 19.4 CDE 8.23 Fluid pack lot # [id number]  . JOINT REPLACEMENT     LEFT THR/  BIL TKR  . JOINT REPLACEMENT Bilateral    TKR    Allergies: Allergies  Allergen Reactions  . Allopurinol Swelling  . Lasix [Furosemide]     Home Medications: Medications Prior to Admission  Medication Sig Dispense Refill Last Dose  .  levothyroxine (SYNTHROID, LEVOTHROID) 75 MCG tablet Take 75 mcg by mouth daily before breakfast.   02/01/2020 at 0900  . omeprazole (PRILOSEC) 20 MG capsule Take 20 mg by mouth daily before breakfast.    02/01/2020 at 0900  . albuterol (PROVENTIL HFA;VENTOLIN HFA) 108 (90 Base) MCG/ACT inhaler Inhale 2 puffs into the lungs every 6 (six) hours as needed for wheezing or shortness of breath.    Not Taking at PRN  . aspirin EC 81 MG tablet Take 81 mg by mouth  daily with breakfast.   Not Taking at Unknown  . Calcium Carb-Cholecalciferol (OYSTER SHELL CALCIUM) 500-400 MG-UNIT TABS Take 1 tablet by mouth 2 (two) times daily.   Not Taking at Unknown  . montelukast (SINGULAIR) 4 MG chewable tablet Chew 4 mg by mouth daily.   Not Taking at Unknown  . Multiple Vitamin (MULTIVITAMIN WITH MINERALS) TABS tablet Take 1 tablet by mouth daily.   Not Taking at Unknown  . Omega-3 1000 MG CAPS Take 1,000 mg by mouth daily.   Not Taking at [place] medication reconciliation was completed with the patient.   Scheduled Inpatient Medications:   . levothyroxine  75 mcg Oral QAC breakfast  . pantoprazole (PROTONIX) IV  40 mg Intravenous Q12H    Continuous Inpatient Infusions:   . heparin 1,250 Units/hr (02/03/20 1201)  . lactated ringers 950 mL (02/03/20 0505)    PRN Inpatient Medications:  acetaminophen **OR** acetaminophen, albuterol  Family History: family history is not on file.  The patient's family history is negative for inflammatory bowel disorders, GI malignancy, or solid organ transplantation.  Social History:   reports that he has quit smoking. His smoking use included cigarettes. He has never used smokeless tobacco. He reports that he does not drink alcohol or use drugs. The patient denies ETOH, tobacco, or drug use.   Review of Systems:  Unable to obtain due to patient's delirium See HPI   Physical Examination: BP 122/63 (BP Location: Right Arm)   Pulse 64   Temp 97.9 F (36.6 C) (Oral)   Resp 18   Ht 6' 2"  (1.88 m)   Wt 76.6 kg   SpO2 99%   BMI 21.67 kg/m  Pleasantly confused elderly male in hospital bed. No acute distress.  Gen: NAD, alert and oriented to self HEENT: PEERLA, EOMI, Neck: supple, no JVD or thyromegaly Chest: CTA bilaterally, no wheezes, crackles, or other adventitious sounds CV: RRR, no m/g/c/r Abd: soft, ND, +BS in all four quadrants; mild tenderness to deep palpation in RUQ, no HSM, guarding, ridigity, or  rebound tenderness, negative Murphy's sign Ext: no edema, well perfused with 2+ pulses, Skin: no rash or lesions noted Lymph: no LAD  Data: Lab Results  Component Value Date   WBC 5.6 02/03/2020   HGB 11.9 (L) 02/03/2020   HCT 36.8 (L) 02/03/2020   MCV 91.1 02/03/2020   PLT 108 (L) 02/03/2020   Recent Labs  Lab 02/03/2020 1114 02/03/20 0658  HGB 13.6 11.9*   Lab Results  Component Value Date   NA 141 02/03/2020   K 3.9 02/03/2020   CL 110 02/03/2020   CO2 24 02/03/2020   BUN 36 (H) 02/03/2020   CREATININE 1.64 (H) 02/03/2020   Lab Results  Component Value Date   ALT 19 02/03/2020   AST 24 02/03/2020   ALKPHOS 57 02/03/2020   BILITOT 1.2 02/03/2020   Recent Labs  Lab 02/03/20 1040  APTT 40*  INR 1.1   Assessment/Plan:  86 y/o Caucasian male with a PMH of HTN, hypothyroidism, BPH, COPD admitted for RUQ abd pain with imaging findings consistent with choledocholithiasis   1. Choledocholithiasis - MRCP confirmed choledocholithiasis with enlarged CBD up to 8 mm, sludge and multiple stones within gallbladder lumen w/o any RUQ inflammatory changes  2. Acute DVT in right lower extremity - seen on imaging today, started on heparin by hospital team   - Patient's clinical presentation and imaging is consistent with choledocholithiasis.  LFTs are within normal limits and there are no signs of acute extrahepatic biliary obstruction.  There are no signs of cholangitis or gallstone pancreatitis.  ERCP is not deemed urgent at this time. - Patient should follow-up as an outpatient closely with Dr. Allen Norris for consideration of elective ERCP. This should be scheduled before patient is discharged. - Continue serial abdominal examinations and continue to follow LFTs to ensure no transaminitis or cholestasis - ERCP is not available at [facility] this week. If LFTs acutely worsen, he will need transfer for urgent ERCP - He may advance diet as tolerated. Low fat diet advised. - Continue care for  acute DVT per hospital team - GI will continue to follow along peripherally   Thank you for the consult. Please call with questions or concerns.  Reeves Forth [facility] [phone] (Cell)

## 2020-02-03 NOTE — Progress Notes (Signed)
Patient left for ultrasound at 1045am. Breathing even and unlabroed. No distress noted.   Returned at 1145. Breathing even and unlabored. No distress noted. Will continue to monitor closely.

## 2020-02-04 ENCOUNTER — Encounter: Admission: EM | Disposition: E | Payer: Self-pay | Source: Home / Self Care | Attending: Internal Medicine

## 2020-02-04 DIAGNOSIS — I82431 Acute embolism and thrombosis of right popliteal vein: Secondary | ICD-10-CM

## 2020-02-04 DIAGNOSIS — I82421 Acute embolism and thrombosis of right iliac vein: Secondary | ICD-10-CM

## 2020-02-04 DIAGNOSIS — I82411 Acute embolism and thrombosis of right femoral vein: Secondary | ICD-10-CM

## 2020-02-04 HISTORY — PX: PERIPHERAL VASCULAR THROMBECTOMY: CATH118306

## 2020-02-04 LAB — CBC WITH DIFFERENTIAL/PLATELET
Abs Immature Granulocytes: 0.02 10*3/uL (ref 0.00–0.07)
Basophils Absolute: 0 10*3/uL (ref 0.0–0.1)
Basophils Relative: 1 %
Eosinophils Absolute: 0.1 10*3/uL (ref 0.0–0.5)
Eosinophils Relative: 2 %
HCT: 38.2 % — ABNORMAL LOW (ref 39.0–52.0)
Hemoglobin: 12.2 g/dL — ABNORMAL LOW (ref 13.0–17.0)
Immature Granulocytes: 0 %
Lymphocytes Relative: 14 %
Lymphs Abs: 0.7 10*3/uL (ref 0.7–4.0)
MCH: 29.3 pg (ref 26.0–34.0)
MCHC: 31.9 g/dL (ref 30.0–36.0)
MCV: 91.6 fL (ref 80.0–100.0)
Monocytes Absolute: 0.5 10*3/uL (ref 0.1–1.0)
Monocytes Relative: 10 %
Neutro Abs: 3.5 10*3/uL (ref 1.7–7.7)
Neutrophils Relative %: 73 %
Platelets: 90 10*3/uL — ABNORMAL LOW (ref 150–400)
RBC: 4.17 MIL/uL — ABNORMAL LOW (ref 4.22–5.81)
RDW: 13.2 % (ref 11.5–15.5)
WBC: 4.7 10*3/uL (ref 4.0–10.5)
nRBC: 0 % (ref 0.0–0.2)

## 2020-02-04 LAB — COMPREHENSIVE METABOLIC PANEL
ALT: 21 U/L (ref 0–44)
AST: 29 U/L (ref 15–41)
Albumin: 3.7 g/dL (ref 3.5–5.0)
Alkaline Phosphatase: 56 U/L (ref 38–126)
Anion gap: 9 (ref 5–15)
BUN: 29 mg/dL — ABNORMAL HIGH (ref 8–23)
CO2: 24 mmol/L (ref 22–32)
Calcium: 8.8 mg/dL — ABNORMAL LOW (ref 8.9–10.3)
Chloride: 109 mmol/L (ref 98–111)
Creatinine, Ser: 1.47 mg/dL — ABNORMAL HIGH (ref 0.61–1.24)
GFR calc Af Amer: 50 mL/min — ABNORMAL LOW (ref >60)
GFR calc non Af Amer: 43 mL/min — ABNORMAL LOW (ref >60)
Glucose, Bld: 105 mg/dL — ABNORMAL HIGH (ref 70–99)
Potassium: 4 mmol/L (ref 3.5–5.1)
Sodium: 142 mmol/L (ref 135–145)
Total Bilirubin: 0.9 mg/dL (ref 0.3–1.2)
Total Protein: 7 g/dL (ref 6.5–8.1)

## 2020-02-04 LAB — HEPARIN LEVEL (UNFRACTIONATED): Heparin Unfractionated: 1.06 IU/mL — ABNORMAL HIGH (ref 0.30–0.70)

## 2020-02-04 SURGERY — PERIPHERAL VASCULAR THROMBECTOMY
Anesthesia: Moderate Sedation | Laterality: Right

## 2020-02-04 MED ORDER — FAMOTIDINE 20 MG PO TABS: 40.0000 mg | ORAL_TABLET | Freq: Once | ORAL | Status: DC | PRN

## 2020-02-04 MED ORDER — BENZONATATE 100 MG PO CAPS
100.0000 mg | ORAL_CAPSULE | Freq: Three times a day (TID) | ORAL | Status: DC
  Administered 2020-02-04 – 2020-02-06 (×5): 100 mg via ORAL
  Filled 2020-02-04 (×6): qty 1

## 2020-02-04 MED ORDER — HEPARIN SODIUM (PORCINE) 1000 UNIT/ML IJ SOLN
INTRAMUSCULAR | Status: AC
  Filled 2020-02-04: qty 1

## 2020-02-04 MED ORDER — MIDAZOLAM HCL 5 MG/5ML IJ SOLN
INTRAMUSCULAR | Status: AC
  Filled 2020-02-04: qty 5

## 2020-02-04 MED ORDER — HYDROMORPHONE HCL 1 MG/ML IJ SOLN: 1.0000 mg | Freq: Once | INTRAMUSCULAR | Status: DC | PRN

## 2020-02-04 MED ORDER — HEPARIN SODIUM (PORCINE) 1000 UNIT/ML IJ SOLN
INTRAMUSCULAR | Status: DC | PRN
  Administered 2020-02-04: 4000 [IU] via INTRAVENOUS

## 2020-02-04 MED ORDER — FENTANYL CITRATE (PF) 100 MCG/2ML IJ SOLN
INTRAMUSCULAR | Status: AC
  Filled 2020-02-04: qty 2

## 2020-02-04 MED ORDER — MIDAZOLAM HCL 2 MG/2ML IJ SOLN
INTRAMUSCULAR | Status: DC | PRN
  Administered 2020-02-04: 2 mg via INTRAVENOUS
  Administered 2020-02-04: 1 mg via INTRAVENOUS

## 2020-02-04 MED ORDER — METHYLPREDNISOLONE SODIUM SUCC 125 MG IJ SOLR: 125.0000 mg | Freq: Once | INTRAMUSCULAR | Status: DC | PRN

## 2020-02-04 MED ORDER — FENTANYL CITRATE (PF) 100 MCG/2ML IJ SOLN
INTRAMUSCULAR | Status: DC | PRN
  Administered 2020-02-04: 25 ug via INTRAVENOUS
  Administered 2020-02-04: 50 ug via INTRAVENOUS

## 2020-02-04 MED ORDER — CEFAZOLIN SODIUM-DEXTROSE 1-4 GM/50ML-% IV SOLN
1.0000 g | Freq: Once | INTRAVENOUS | Status: AC
  Administered 2020-02-04: 15:00:00 1 g via INTRAVENOUS
  Filled 2020-02-04: qty 50

## 2020-02-04 MED ORDER — GUAIFENESIN-DM 100-10 MG/5ML PO SYRP
5.0000 mL | ORAL_SOLUTION | ORAL | Status: DC | PRN
  Administered 2020-02-06: 15:00:00 5 mL via ORAL
  Filled 2020-02-04: qty 5

## 2020-02-04 MED ORDER — ONDANSETRON HCL 4 MG/2ML IJ SOLN: 4.0000 mg | Freq: Four times a day (QID) | INTRAMUSCULAR | Status: DC | PRN

## 2020-02-04 MED ORDER — HALOPERIDOL LACTATE 5 MG/ML IJ SOLN
2.0000 mg | Freq: Once | INTRAMUSCULAR | Status: AC
  Administered 2020-02-04: 04:00:00 2 mg via INTRAVENOUS
  Filled 2020-02-04: qty 1

## 2020-02-04 MED ORDER — DIPHENHYDRAMINE HCL 50 MG/ML IJ SOLN: 50.0000 mg | Freq: Once | INTRAMUSCULAR | Status: DC | PRN

## 2020-02-04 MED ORDER — SODIUM CHLORIDE 0.9 % IV SOLN: INTRAVENOUS | Status: DC

## 2020-02-04 MED ORDER — ALTEPLASE 2 MG IJ SOLR
INTRAMUSCULAR | Status: DC | PRN
  Administered 2020-02-04: 6 mg

## 2020-02-04 MED ORDER — HALOPERIDOL LACTATE 5 MG/ML IJ SOLN
2.0000 mg | INTRAMUSCULAR | Status: DC | PRN
  Administered 2020-02-05: 2 mg via INTRAVENOUS
  Filled 2020-02-04 (×2): qty 1

## 2020-02-04 MED ORDER — MIDAZOLAM HCL 2 MG/ML PO SYRP
8.0000 mg | ORAL_SOLUTION | Freq: Once | ORAL | Status: DC | PRN
  Filled 2020-02-04: qty 4

## 2020-02-04 SURGICAL SUPPLY — 14 items
BALLN ARMADA 14X80X80 (BALLOONS) ×3
BALLN DORADO 10X80X80 (BALLOONS) ×3
BALLOON ARMADA 14X80X80 (BALLOONS) IMPLANT
BALLOON DORADO 10X80X80 (BALLOONS) IMPLANT
CANISTER PENUMBRA ENGINE (MISCELLANEOUS) ×2 IMPLANT
CANNULA 5F STIFF (CANNULA) ×2 IMPLANT
CATH BEACON 5 .035 65 KMP TIP (CATHETERS) ×2 IMPLANT
CATH INDIGO 12XTORQ 100 (CATHETERS) ×2 IMPLANT
CATH INDIGO SEP 12 (CATHETERS) ×2 IMPLANT
DEVICE PRESTO INFLATION (MISCELLANEOUS) ×2 IMPLANT
PACK ANGIOGRAPHY (CUSTOM PROCEDURE TRAY) ×2 IMPLANT
SHEATH PINNACLE 11FRX10 (SHEATH) ×2 IMPLANT
SUT PROLENE 0 CT 1 30 (SUTURE) ×2 IMPLANT
WIRE MAGIC TORQUE 260C (WIRE) ×2 IMPLANT

## 2020-02-04 NOTE — Plan of Care (Signed)

## 2020-02-04 NOTE — Op Note (Signed)
VEIN AND VASCULAR SURGERY   OPERATIVE NOTE   PRE-OPERATIVE DIAGNOSIS: extensive right lower extremity DVT  POST-OPERATIVE DIAGNOSIS: same   PROCEDURE: 1. US guidance for vascular access to the right popliteal vein 2. Catheter placement into right common iliac vein from right popliteal approach 3. IVC gram and right lower extremity venogram 4.   Catheter directed thrombolysis with 6 mg TPA delivered through the right iliac veins, common femoral vein, and superficial femoral vein  5. Mechanical thrombectomy to right popliteal vein, superficial femoral vein, common femoral vein, external and common iliac veins with the penumbra cat 12 device 6. PTA of the common femoral vein with 10 mm balloon 7. PTA of right common iliac vein with 10 and 14 mm balloons   SURGEON:  , MD  ASSISTANT(S): none  ANESTHESIA: local with moderate conscious sedation for 50 minutes using 3 mg of Versed and 75 mcg of Fentanyl  ESTIMATED BLOOD LOSS: 150 cc  FINDING(S): 1. Extensive thrombus throughout the right lower extremity veins all the way up to the confluence to the IVC.  This is a combination of chronic thrombus and more acute appearing thrombus.  SPECIMEN(S): none  INDICATIONS:  Patient is a 85 y.o. male who presents with an extensive right lower extremity DVT. Patient has marked leg swelling and pain. Venous intervention is performed to reduce the symtpoms and avoid long term postphlebitic symptoms.   DESCRIPTION: After obtaining full informed written consent, the patient was brought back to the vascular suite and placed supine upon the table.Moderate conscious sedation was administered during a face to face encounter with the patient throughout the procedure with my supervision of the RN administering medicines and monitoring the patient's vital signs, pulse oximetry, telemetry and mental status throughout from the start of the procedure until the patient was taken  to the recovery room. After obtaining adequate anesthesia, the patient was prepped and draped in the standard fashion. The patient was then placed into the prone position. The right popliteal vein was then accessed under direct ultrasound guidance without difficulty with a micropuncture needle and a permanent image was recorded. I then upsized to an 12Fr sheath over a J wire. 4000 units of heparin were then given. Imaging showed extensive DVT with minimal flow. A Kumpe catheter and Magic tourque wire were then advanced into the CFV and images were performed. There remained nearly occlusive thrombus throughout the entirety of the iliac vein system as well.  I was able to cross the thrombus and stenosis and advance into the right iliac vein which had continued thrombus and then into the IVC which was patent. I then used the Kumpe catheter and instilled 6 mg of tpa throughout the right iliac veins, common femoral vein, and down in the superficial femoral veins.  After this dwelled, I used the Penumbra Cat 12 catheter and evacuated about 50 cc of effluent with mechanical thrombectomy throughout the iliac veins, CFV, SFV, and proximal popliteal vein.  Large amounts of thrombus were removed with each pass and several passes were made.  The effluent returned included chronic appearing thrombus as well as some acute appearing thrombus and with every pass more thrombus seem to be removed. This had significant improvement with near resolution of the thrombus in the superficial femoral vein and only narrowing at the common femoral vein which was likely an area of chronic thrombus that had sclerosed.  There remained near occlusive thrombosis and potentially stenosis in the common iliac vein as well. I then treated the   CFV with an 8 cm length 10 mm diameter angioplasty balloon.  A waist was taken which resolved at about 8 atm.  The balloon was then advanced up into the common iliac vein and inflated up to 10 atm for  1 minute. This resulted in some resolution of the thrombus and improved flow in the common femoral vein but the common iliac vein remain nearly completely occluded. I then turned my attention to the iliac veins. 2 more passes with the penumbra cat 12 device were donned evacuating another 50 to 100 cc of effluent with several more large chunks of thrombus.  The stenosis/occlusion and thrombus within the common iliac veins was treated with a 14 mm diameter by 8 cm length angioplasty balloon.  This was inflated to 8 atm for 1 minute.  Completion imaging showed some residual narrowing and thrombus right at the most proximal common iliac vein at the confluence into the IVC in a location where stent placement would be difficult or impossible.  Another pass with the penumbra cat 12 device help this and the residual area of narrowing appeared to be less than 50% following these treatments.  The amount of thrombus removed was tremendous and only a small amount of residual thrombus throughout the right lower extremity remained down to the popliteal vein.  I then elected to terminate the procedure. The sheath was removed and a dressing was placed. She was taken to the recovery room in stable condition having tolerated the procedure well.   COMPLICATIONS: None  CONDITION: Stable    01/21/2020 4:26 PM 

## 2020-02-04 NOTE — H&P (Signed)
[facility] History & Physical Update  The patient was interviewed and re-examined.  The patient's previous History and Physical has been reviewed and is unchanged.  There is no change in the plan of care. We plan to proceed with the scheduled procedure.  Leotis Pain, MD  02/02/2020, 2:47 PM

## 2020-02-04 NOTE — Progress Notes (Signed)
[place] visited pt. while rounding on 2A; pt in bed lying down when [facility] arrived; pt. not oriented to location (thought he was in [place]); asked why he was brought to hospital --no memory of feeling unwell; [place] may attempt another visit @ later time and is available if needed.     02/02/2020 1330  Clinical Encounter Type  Visited With Patient  Visit Type Initial;Psychological support;Social support  Stress Factors  Patient Stress Factors Lack of knowledge

## 2020-02-04 NOTE — Progress Notes (Signed)
Dr. Lucky Cowboy out in recovery now to see pt.. Right leg wrapped in [place] from right thigh to ankle. Pt. Responds to verbal stimuli: knows self only at present.

## 2020-02-04 NOTE — Consult Note (Signed)
ANTICOAGULATION CONSULT NOTE - Initial Consult  Pharmacy Consult for heparin drip Indication: DVT/VTE suspected (elevated D-Dimer > 10,000 and leg swelling/warmth pending ultrasound)  Allergies  Allergen Reactions  . Allopurinol Swelling  . Lasix [Furosemide]     Patient Measurements: Height: 6\' 2"  (188 cm) Weight: 168 lb 12.8 oz (76.6 kg) IBW/kg (Calculated) : 82.2 Heparin Dosing Weight: 76.6kg  Vital Signs: Temp: 97.8 F (36.6 C) (02/25 0757) Temp Source: Oral (02/25 0757) BP: 174/91 (02/25 0757) Pulse Rate: 65 (02/25 0757)  Labs: Recent Labs    01/14/2020 1114 02/05/2020 1114 01/28/2020 1656 02/01/2020 1847 01/22/2020 2042 01/26/2020 2254 02/03/20 0658 02/03/20 1040 02/03/20 2036 02/03/20 2221 01/19/2020 0828  HGB 13.6   < >  --   --   --   --  11.9*  --   --   --  12.2*  HCT 42.4  --   --   --   --   --  36.8*  --   --   --  38.2*  PLT 131*  --   --   --   --   --  108*  --   --   --  90*  APTT  --   --   --   --   --   --   --  40*  --   --   --   LABPROT  --   --   --   --   --   --  14.6 14.2  --   --   --   INR  --   --   --   --   --   --  1.2 1.1  --   --   --   HEPARINUNFRC  --   --   --   --   --   --   --   --  1.68* 1.57* 1.06*  CREATININE 1.92*  --   --   --   --   --  1.64*  --   --   --  1.47*  TROPONINIHS 6  --    < > 7 7 8   --   --   --   --   --    < > = values in this interval not displayed.    Estimated Creatinine Clearance: 41.3 mL/min (A) (by C-G formula based on SCr of 1.47 mg/dL (H)).   Medical History: Past Medical History:  Diagnosis Date  . Asthma   . BPH (benign prostatic hyperplasia)   . COPD (chronic obstructive pulmonary disease) ([place])   . Diverticulosis   . GERD (gastroesophageal reflux disease)   . Gout   . Gout   . History of kidney stones   . Hypertension   . Hypothyroidism   . Klinefelter syndrome   . Nephrolithiasis   . [facility] spotted fever    history of....  . Shingles   . Sleep apnea    NO CPAP  . Stroke [facility])     TIA    Medications:  No pta anticoagulant of record.  Pt was on SQH prior to start of drip - last dose 5000 units sq @ 0512 0224.  Assessment: Pharmacy has been consulted to initiate and monitor heparin drip in this 86 yo male with elevated D-Dimer and leg swelling pending ultrasound.  Dosing started @ 1250 uints/hr  2/24 @ 2221 HL = 1.57 dose held x 1 hour, reduced to 900 units/hr 2/25 @ 0828  HL = 1.06  Goal of Therapy:  Heparin level 0.3-0.7 units/ml Monitor platelets by anticoagulation protocol: Yes   Plan:  02/24 @ 0828 HL 1.06 supratherapeutic will hold drip for 1 hour and restart at 1030 at 700 units/hr and will recheck HL at [place], PharmD, BCPS Clinical Pharmacist 01/27/2020 9:44 AM

## 2020-02-04 NOTE — Progress Notes (Signed)
PROGRESS NOTE    Richard Fry  [id number] DOB: [birth year] DOA: 01/19/2020 PCP: Tracie Harrier, MD   Brief Narrative:  Richard Fry is a 86 y.o. male with medical history significant of htn,hypothyroidism,RMSF seen in ed for chest pain, which pt then points to ruq pain. HPIis per edmd note pt is altered with sitter at bedside, he is delirius and says he lives in [place]. Per caretaker, at PCP appt few weeks ago it showed liver enzymes were high in the 300's and had usg was done and repeat usg today shows dilated CBD.   2/24: Patient seen and examined.  Sitter at bedside.  Patient appears alert and is answering all questions appropriately.  MRCP findings noted.  Reached out to gastroenterology.  Currently no emergent indication for ERCP as LFTs are trending down patient is nontoxic and noncholangitic in appearance.  Markedly elevated D-dimer noted.  Right lower extremity Doppler ordered.  Patient has extensive clot burden in nearly all the vasculature within the right lower extremity.  2/25: Patient seen and examined.  Remains somewhat confused.  Liver function stable.  No signs of cholangitis.  Extensive DVT in right lower extremity.  Vascular consulted yesterday.  Plan for surgical thrombectomy/venous lysis today.  N.p.o. for procedure.   Assessment & Plan:   Principal Problem:   Chest pain Active Problems:   COPD (chronic obstructive pulmonary disease) (HCC)   Hypothyroidism   Hypertension  Acute lower extremity DVT Patient had marked elevated D-dimer Right lower extremity duplex confirms extensive acute DVT Heparin GTT initiated Vascular surgery consult called for evaluation for surgical thrombectomy Plan for surgical thrombectomy 01/18/2020 with Dr. Lucky Cowboy  Acute choledocholithiasis Patient has stone in common bile duct with 8 mm CBD on MRCP GI consulted, currently no emergent indication for ERCP and there is no ERCP availability at [facility] this week Will defer endoscopic  evaluation for now given presence of acute DVT and need for therapeutic anticoagulation Monitor daily liver function If patient develops any signs of acute cholangitis will likely require transfer to [facility] for advanced endoscopic procedure Defer antibiotics for now  Hypothyroidism Continue home Synthroid  Hypertension Currently on no medications Pressure controlled Vitals per unit protocol  COPD Stable, no evidence of exacerbation Will continue home regimen of albuterol and Singulair   DVT prophylaxis: Heparin GTT Code Status: DNR Family Communication: Spoke with caregiver/MPOA phone, 02/03/2020 Disposition Plan: Unclear at this time, home with home health versus skilled nursing facility  Consultants:   [facility]  Procedures:  none  Antimicrobials:   none   Subjective: Seen and examined Still endorsing some right upper quadrant pain Nontoxic in appearance Alert oriented x2  Objective: Vitals:   02/03/20 1900 02/01/2020 0500 01/21/2020 0655 01/29/2020 0757  BP: (!) 159/73 (!) 172/73 (!) 164/73 (!) 174/91  Pulse: 72 69 67 65  Resp: 19 19  18   Temp: 97.8 F (36.6 C) (!) 97.5 F (36.4 C)  97.8 F (36.6 C)  TempSrc: Oral Oral  Oral  SpO2: 99% 98%  99%  Weight:      Height:        Intake/Output Summary (Last 24 hours) at 01/12/2020 1358 Last data filed at 02/03/2020 0504 Gross per 24 hour  Intake 930.83 ml  Output 1900 ml  Net -969.17 ml   Filed Weights   01/13/2020 1134 01/25/2020 1629  Weight: 77.1 kg 76.6 kg    Examination:  General exam: Appears calm and comfortable  Respiratory system: Clear to auscultation. Respiratory effort normal.  Cardiovascular system: S1 & S2 heard, RRR. No JVD, murmurs, rubs, gallops or clicks. No pedal edema. Gastrointestinal system: Tender to palpation right upper quadrant, none distended, normal bowel sounds Central nervous system: Alert and oriented. No focal neurological deficits. Extremities: Symmetric 5 x 5  power. Skin: No rashes, lesions or ulcers Psychiatry: Judgement and insight appear normal. Mood & affect appropriate.     Data Reviewed: I have personally reviewed following labs and imaging studies  CBC: Recent Labs  Lab 02/05/2020 1114 02/03/20 0658 01/21/2020 0828  WBC 7.8 5.6 4.7  NEUTROABS  --  3.7 3.5  HGB 13.6 11.9* 12.2*  HCT 42.4 36.8* 38.2*  MCV 92.4 91.1 91.6  PLT 131* 108* 90*   Basic Metabolic Panel: Recent Labs  Lab 01/11/2020 1114 02/03/20 0658 01/25/2020 0828  NA 140 141 142  K 4.9 3.9 4.0  CL 107 110 109  CO2 22 24 24   GLUCOSE 106* 98 105*  BUN 40* 36* 29*  CREATININE 1.92* 1.64* 1.47*  CALCIUM 9.5 9.1 8.8*  MG  --  2.0  --    GFR: Estimated Creatinine Clearance: 41.3 mL/min (A) (by C-G formula based on SCr of 1.47 mg/dL (H)). Liver Function Tests: Recent Labs  Lab 02/06/2020 1114 02/03/20 0658 02/04/20 0828  AST 39 24 29  ALT 27 19 21   ALKPHOS 68 57 56  BILITOT 1.6* 1.2 0.9  PROT 7.9 7.1 7.0  ALBUMIN 4.3 3.6 3.7   Recent Labs  Lab 02/03/20 0658  LIPASE 40   Recent Labs  Lab 02/03/20 0658  AMMONIA 25   Coagulation Profile: Recent Labs  Lab 02/03/20 0658 02/03/20 1040  INR 1.2 1.1   Cardiac Enzymes: No results for input(s): CKTOTAL, CKMB, CKMBINDEX, TROPONINI in the last 168 hours. BNP (last 3 results) No results for input(s): PROBNP in the last 8760 hours. HbA1C: Recent Labs    01/12/2020 1656  HGBA1C 5.4   CBG: No results for input(s): GLUCAP in the last 168 hours. Lipid Profile: Recent Labs    01/12/2020 1656  CHOL 180  HDL 39*  LDLCALC 122*  TRIG 93  CHOLHDL 4.6   Thyroid Function Tests: Recent Labs    01/29/2020 1656  TSH 0.336*  FREET4 1.36*   Anemia Panel: No results for input(s): VITAMINB12, FOLATE, FERRITIN, TIBC, IRON, RETICCTPCT in the last 72 hours. Sepsis Labs: No results for input(s): PROCALCITON, LATICACIDVEN in the last 168 hours.  Recent Results (from the past 240 hour(s))  SARS CORONAVIRUS 2 (TAT  6-24 HRS) Nasopharyngeal Nasopharyngeal Swab     Status: None   Collection Time: 01/23/2020  4:03 PM   Specimen: Nasopharyngeal Swab  Result Value Ref Range Status   SARS Coronavirus 2 NEGATIVE NEGATIVE Final    Comment: (NOTE) SARS-CoV-2 target nucleic acids are NOT DETECTED. The SARS-CoV-2 RNA is generally detectable in upper and lower respiratory specimens during the acute phase of infection. Negative results do not preclude SARS-CoV-2 infection, do not rule out co-infections with other pathogens, and should not be used as the sole basis for treatment or other patient management decisions. Negative results must be combined with clinical observations, patient history, and epidemiological information. The expected result is Negative. Fact Sheet for Patients: SugarRoll.be Fact Sheet for Healthcare Providers: https://www.woods-mathews.com/ This test is not yet approved or cleared by the [place] FDA and  has been authorized for detection and/or diagnosis of SARS-CoV-2 by FDA under an Emergency Use Authorization (EUA). This EUA will remain  in effect (meaning this test can  be used) for the duration of the COVID-19 declaration under Section 56 4(b)(1) of the Act, 21 U.S.C. section 360bbb-3(b)(1), unless the authorization is terminated or revoked sooner. Performed at [facility], [place] [address], [place], [place] [address]   Culture, blood (Routine X 2) w Reflex to ID Panel     Status: None (Preliminary result)   Collection Time: 02/01/2020  4:56 PM   Specimen: BLOOD  Result Value Ref Range Status   Specimen Description BLOOD BLOOD RIGHT HAND  Final   Special Requests   Final    BOTTLES DRAWN AEROBIC AND ANAEROBIC Blood Culture adequate volume   Culture   Final    NO GROWTH 2 DAYS Performed at [facility], [address] [place], [place] [address]    Report Status PENDING  Incomplete  Culture, blood (Routine X 2) w Reflex  to ID Panel     Status: None (Preliminary result)   Collection Time: 01/23/2020  4:56 PM   Specimen: BLOOD  Result Value Ref Range Status   Specimen Description BLOOD BLOOD LEFT HAND  Final   Special Requests   Final    BOTTLES DRAWN AEROBIC AND ANAEROBIC Blood Culture adequate volume   Culture   Final    NO GROWTH 2 DAYS Performed at [facility], [address], [place], [place] [address]    Report Status PENDING  Incomplete  Urine Culture     Status: Abnormal   Collection Time: 01/15/2020 11:07 PM   Specimen: Urine, Random  Result Value Ref Range Status   Specimen Description   Final    URINE, RANDOM Performed at [facility], [address], [place], [place] [address]    Special Requests   Final    NONE Performed at [facility], [address], [place], [place] [address]    Culture (A)  Final    <10,000 COLONIES/mL INSIGNIFICANT GROWTH Performed at [facility], [place] [address], [place], [place] [address]    Report Status 02/03/2020 FINAL  Final         Radiology Studies: DG Skull 1-3 Views  Result Date: 02/04/2020 CLINICAL DATA:  Screening for foreign body for MRI EXAM: SKULL - 1-3 VIEW COMPARISON:  None. FINDINGS: No intracranial foreign body. No orbital foreign body. No acute skeletal abnormality. IMPRESSION: Negative for intracranial or orbital foreign body. Electronically Signed   By: Franchot Gallo M.D.   On: 01/30/2020 15:01   DG Pelvis 1-2 Views  Result Date: 01/15/2020 CLINICAL DATA:  MRI clearance EXAM: PELVIS - 1-2 VIEW COMPARISON:  None. FINDINGS: Left hip replacement in satisfactory position. No other foreign body. Mild degenerative change right hip.  No acute skeletal abnormality. IMPRESSION: Left hip replacement.  No contraindication to MRI in the pelvis. Electronically Signed   By: Franchot Gallo M.D.   On: 01/24/2020 15:02   CT HEAD WO CONTRAST  Result Date: 01/29/2020 CLINICAL DATA:  Encephalopathy EXAM: CT HEAD  WITHOUT CONTRAST TECHNIQUE: Contiguous axial images were obtained from the base of the skull through the vertex without intravenous contrast. COMPARISON:  08/10/2015 FINDINGS: Brain: No evidence of acute infarction, hemorrhage, hydrocephalus, extra-axial collection or mass lesion/mass effect. Extensive low-density changes within the periventricular and subcortical white matter compatible with chronic microvascular ischemic change. Moderate diffuse cerebral volume loss. Vascular: No hyperdense vessel or unexpected calcification. Skull: Normal. Negative for fracture or focal lesion. Sinuses/Orbits: No acute finding. Other: None. IMPRESSION: 1.  No acute intracranial findings. 2.  Chronic  microvascular ischemic change and cerebral volume loss. Electronically Signed   By: Davina Poke D.O.   On: 01/26/2020 16:28   MR ABDOMEN MRCP WO CONTRAST  Result Date: 01/23/2020 CLINICAL DATA:  Cholelithiasis chest pain EXAM: MRI ABDOMEN WITHOUT CONTRAST  (INCLUDING MRCP) TECHNIQUE: Multiplanar multisequence MR imaging of the abdomen was performed. Heavily T2-weighted images of the biliary and pancreatic ducts were obtained, and three-dimensional MRCP images were rendered by post processing. COMPARISON:  Ultrasound 01/22/2020 FINDINGS: Lower chest: Multiple sequences are degraded by respiratory motion. No acute findings. Hepatobiliary: No focal hepatic abnormality. Sludge and multiple small stones within the gallbladder. No significant intra hepatic biliary dilatation. Slightly enlarged extrahepatic common bile duct measuring up to 8 mm. Multiple hypointense filling defects within the mid and distal common bile duct measuring up to 7 mm in size, for example series 15, image number 16, and series 2 image number 15 and 16. Pancreas: No mass, inflammatory changes, or other parenchymal abnormality identified. Spleen:  Accessory splenule.  Spleen otherwise unremarkable Adrenals/Urinary Tract: Adrenal glands are within normal  limits. Multiple cysts within the bilateral kidneys. On the right, cysts measure up to 3.7 cm. On the left, cysts measure up to 8.7 cm. No hydronephrosis. Stomach/Bowel: Visualized portions within the abdomen are unremarkable. Vascular/Lymphatic: No pathologically enlarged lymph nodes identified. No abdominal aortic aneurysm demonstrated. Other:  No ascites in the upper abdomen Musculoskeletal: No suspicious bone lesions identified. IMPRESSION: 1. Sludge and multiple small stones within the gallbladder. No definitive right upper quadrant inflammatory changes. 2. Choledocholithiasis with slightly enlarged extrahepatic common bile duct up to 8 mm. 3. Multiple renal cysts Electronically Signed   By: Donavan Foil M.D.   On: 01/28/2020 23:12   MR 3D Recon At Scanner  Result Date: 02/01/2020 CLINICAL DATA:  Cholelithiasis chest pain EXAM: MRI ABDOMEN WITHOUT CONTRAST  (INCLUDING MRCP) TECHNIQUE: Multiplanar multisequence MR imaging of the abdomen was performed. Heavily T2-weighted images of the biliary and pancreatic ducts were obtained, and three-dimensional MRCP images were rendered by post processing. COMPARISON:  Ultrasound 01/21/2020 FINDINGS: Lower chest: Multiple sequences are degraded by respiratory motion. No acute findings. Hepatobiliary: No focal hepatic abnormality. Sludge and multiple small stones within the gallbladder. No significant intra hepatic biliary dilatation. Slightly enlarged extrahepatic common bile duct measuring up to 8 mm. Multiple hypointense filling defects within the mid and distal common bile duct measuring up to 7 mm in size, for example series 15, image number 16, and series 2 image number 15 and 16. Pancreas: No mass, inflammatory changes, or other parenchymal abnormality identified. Spleen:  Accessory splenule.  Spleen otherwise unremarkable Adrenals/Urinary Tract: Adrenal glands are within normal limits. Multiple cysts within the bilateral kidneys. On the right, cysts measure up  to 3.7 cm. On the left, cysts measure up to 8.7 cm. No hydronephrosis. Stomach/Bowel: Visualized portions within the abdomen are unremarkable. Vascular/Lymphatic: No pathologically enlarged lymph nodes identified. No abdominal aortic aneurysm demonstrated. Other:  No ascites in the upper abdomen Musculoskeletal: No suspicious bone lesions identified. IMPRESSION: 1. Sludge and multiple small stones within the gallbladder. No definitive right upper quadrant inflammatory changes. 2. Choledocholithiasis with slightly enlarged extrahepatic common bile duct up to 8 mm. 3. Multiple renal cysts Electronically Signed   By: Donavan Foil M.D.   On: 02/04/2020 23:12   US Venous Img Lower Unilateral Right (DVT)  Result Date: 02/03/2020 CLINICAL DATA:  Right lower extremity pain for the past several days. History of smoking. Evaluate for DVT. EXAM: RIGHT LOWER EXTREMITY VENOUS DOPPLER  ULTRASOUND TECHNIQUE: Gray-scale sonography with graded compression, as well as color Doppler and duplex ultrasound were performed to evaluate the lower extremity deep venous systems from the level of the common femoral vein and including the common femoral, femoral, profunda femoral, popliteal and calf veins including the posterior tibial, peroneal and gastrocnemius veins when visible. The superficial great saphenous vein was also interrogated. Spectral Doppler was utilized to evaluate flow at rest and with distal augmentation maneuvers in the common femoral, femoral and popliteal veins. COMPARISON:  None. FINDINGS: Contralateral Common Femoral Vein: Respiratory phasicity is normal and symmetric with the symptomatic side. No evidence of thrombus. Normal compressibility. The examination is positive for extensive occlusive DVT extending from the right common femoral vein (image 5) into the imaged portions of the right deep femoral vein (image 13). Nonocclusive thrombus is seen within the saphenofemoral junction (image 11). There is hypoechoic  occlusive thrombus involving the proximal (image 18), mid (image 21) and distal (image 26) aspects of the right femoral vein, extending to involve the popliteal vein (image 31). Nonocclusive thrombus seen within the saphenofemoral junction (image 11). Hypoechoic occlusive thrombus involving both paired posterior tibial (image 39) and peroneal veins (image 42). IMPRESSION: The examination is positive for extensive occlusive DVT involving near the entirety of the right lower extremity venous system Electronically Signed   By: Sandi Mariscal M.D.   On: 02/03/2020 11:55        Scheduled Meds: . benzonatate  100 mg Oral TID  . levothyroxine  75 mcg Oral QAC breakfast  . pantoprazole (PROTONIX) IV  40 mg Intravenous Q12H   Continuous Infusions: . sodium chloride 75 mL/hr at 02/03/20 2321  . heparin 700 Units/hr (01/15/2020 1102)     LOS: 2 days    Time spent: 35 minutes    Sidney Ace, MD [facility] Pager [phone]  If 7PM-7AM, please contact night-coverage 01/15/2020, 1:58 PM

## 2020-02-05 ENCOUNTER — Encounter: Payer: Self-pay | Admitting: Cardiology

## 2020-02-05 LAB — COMPREHENSIVE METABOLIC PANEL WITH GFR
ALT: 18 U/L (ref 0–44)
AST: 25 U/L (ref 15–41)
Albumin: 3.3 g/dL — ABNORMAL LOW (ref 3.5–5.0)
Alkaline Phosphatase: 49 U/L (ref 38–126)
Anion gap: 7 (ref 5–15)
BUN: 25 mg/dL — ABNORMAL HIGH (ref 8–23)
CO2: 23 mmol/L (ref 22–32)
Calcium: 8.4 mg/dL — ABNORMAL LOW (ref 8.9–10.3)
Chloride: 111 mmol/L (ref 98–111)
Creatinine, Ser: 1.39 mg/dL — ABNORMAL HIGH (ref 0.61–1.24)
GFR calc Af Amer: 54 mL/min — ABNORMAL LOW (ref >60)
GFR calc non Af Amer: 47 mL/min — ABNORMAL LOW (ref >60)
Glucose, Bld: 99 mg/dL (ref 70–99)
Potassium: 3.7 mmol/L (ref 3.5–5.1)
Sodium: 141 mmol/L (ref 135–145)
Total Bilirubin: 1 mg/dL (ref 0.3–1.2)
Total Protein: 6.2 g/dL — ABNORMAL LOW (ref 6.5–8.1)

## 2020-02-05 LAB — CBC WITH DIFFERENTIAL/PLATELET
Abs Immature Granulocytes: 0.03 10*3/uL (ref 0.00–0.07)
Basophils Absolute: 0 10*3/uL (ref 0.0–0.1)
Basophils Relative: 1 %
Eosinophils Absolute: 0.1 10*3/uL (ref 0.0–0.5)
Eosinophils Relative: 2 %
HCT: 31.3 % — ABNORMAL LOW (ref 39.0–52.0)
Hemoglobin: 10 g/dL — ABNORMAL LOW (ref 13.0–17.0)
Immature Granulocytes: 1 %
Lymphocytes Relative: 17 %
Lymphs Abs: 1 10*3/uL (ref 0.7–4.0)
MCH: 29.3 pg (ref 26.0–34.0)
MCHC: 31.9 g/dL (ref 30.0–36.0)
MCV: 91.8 fL (ref 80.0–100.0)
Monocytes Absolute: 0.5 10*3/uL (ref 0.1–1.0)
Monocytes Relative: 9 %
Neutro Abs: 4.5 10*3/uL (ref 1.7–7.7)
Neutrophils Relative %: 70 %
Platelets: 99 10*3/uL — ABNORMAL LOW (ref 150–400)
RBC: 3.41 MIL/uL — ABNORMAL LOW (ref 4.22–5.81)
RDW: 13.2 % (ref 11.5–15.5)
WBC: 6.2 10*3/uL (ref 4.0–10.5)
nRBC: 0 % (ref 0.0–0.2)

## 2020-02-05 LAB — ECHOCARDIOGRAM COMPLETE
Height: 74 in
Weight: 2700.8 oz

## 2020-02-05 LAB — HEPARIN LEVEL (UNFRACTIONATED): Heparin Unfractionated: 0.56 [IU]/mL (ref 0.30–0.70)

## 2020-02-05 MED ORDER — TRAMADOL HCL 50 MG PO TABS
50.0000 mg | ORAL_TABLET | Freq: Four times a day (QID) | ORAL | Status: DC | PRN
  Administered 2020-02-05: 50 mg via ORAL
  Filled 2020-02-05: qty 1

## 2020-02-05 MED ORDER — APIXABAN 5 MG PO TABS: 5.0000 mg | ORAL_TABLET | Freq: Two times a day (BID) | ORAL | Status: DC

## 2020-02-05 MED ORDER — APIXABAN 5 MG PO TABS
10.0000 mg | ORAL_TABLET | Freq: Two times a day (BID) | ORAL | Status: DC
  Administered 2020-02-05 – 2020-02-07 (×4): 10 mg via ORAL
  Filled 2020-02-05 (×4): qty 2

## 2020-02-05 NOTE — Progress Notes (Signed)
ANTICOAGULATION CONSULT NOTE - Initial Consult  Pharmacy Consult for heparin drip--> apixaban Indication: extensive DVT   Allergies  Allergen Reactions  . Allopurinol Swelling  . Lasix [Furosemide]     Patient Measurements: Height: 6\' 2"  (188 cm) Weight: 174 lb 3.2 oz (79 kg) IBW/kg (Calculated) : 82.2   Vital Signs: Temp: 98.7 F (37.1 C) (02/26 0846) Temp Source: Oral (02/26 0846) BP: 159/73 (02/26 0846) Pulse Rate: 75 (02/26 0846)  Labs: Recent Labs    01/21/2020 1847 01/20/2020 2042 01/24/2020 2254 02/03/20 [id number] 02/03/20 [id number] 02/03/20 1040 02/03/20 2036 02/03/20 2221 01/15/2020 0828 02/05/20 0321  HGB  --   --   --  11.9*   < >  --   --   --  12.2* 10.0*  HCT  --   --   --  36.8*  --   --   --   --  38.2* 31.3*  PLT  --   --   --  108*  --   --   --   --  90* 99*  APTT  --   --   --   --   --  40*  --   --   --   --   LABPROT  --   --   --  14.6  --  14.2  --   --   --   --   INR  --   --   --  1.2  --  1.1  --   --   --   --   HEPARINUNFRC  --   --   --   --   --   --    < > 1.57* 1.06* 0.56  CREATININE  --   --   --  1.64*  --   --   --   --  1.47* 1.39*  TROPONINIHS 7 7 8   --   --   --   --   --   --   --    < > = values in this interval not displayed.    Estimated Creatinine Clearance: 45 mL/min (A) (by C-G formula based on SCr of 1.39 mg/dL (H)).     Assessment: Pharmacy has been consulted to transition from heparin to apixaban in this 86 yo male with extensive occlusive DVT involving near the entirety of the right lower extremity venous system per [place].     Goal of Therapy:  Heparin level 0.3-0.7 units/ml Monitor platelets by anticoagulation protocol: Yes   Plan:  Stop heparin drip Start apixaban 10 mg PO BID x7 days then 5 mg PO BID  Will need SCr and CBC at least every three days per policy.  Hgb trending down, per RN no s/sx of bleeding - follow up CBC in AM Discussed plan with RN to give first dose of apixaban when heparin drip is turned off.    Pharmacy will continue to follow.   Rocky Morel, PharmD, BCPS Clinical Pharmacist 02/05/2020 11:44 AM

## 2020-02-05 NOTE — Progress Notes (Signed)
PROGRESS NOTE    Richard Fry  [id number] DOB: 1934-02-12 DOA: 01/17/2020 PCP: Tracie Harrier, MD   Brief Narrative:  Richard Fry is a 86 y.o. male with medical history significant of htn,hypothyroidism,RMSF seen in ed for chest pain, which pt then points to ruq pain. HPIis per edmd note pt is altered with sitter at bedside, he is delirius and says he lives in [place]. Per caretaker, at PCP appt few weeks ago it showed liver enzymes were high in the 300's and had usg was done and repeat usg today shows dilated CBD.   2/24: Patient seen and examined.  Sitter at bedside.  Patient appears alert and is answering all questions appropriately.  MRCP findings noted.  Reached out to gastroenterology.  Currently no emergent indication for ERCP as LFTs are trending down patient is nontoxic and noncholangitic in appearance.  Markedly elevated D-dimer noted.  Right lower extremity Doppler ordered.  Patient has extensive clot burden in nearly all the vasculature within the right lower extremity.  2/25: Patient seen and examined.  Remains somewhat confused.  Liver function stable.  No signs of cholangitis.  Extensive DVT in right lower extremity.  Vascular consulted yesterday.  Plan for surgical thrombectomy/venous lysis today.  N.p.o. for procedure.  2/26: Patient seen and examined.  Apparently was agitated yesterday evening required administration of Haldol.  Somewhat altered this morning, trying to get out of bed.  Easily redirected.  Postop day #1 status post surgical thrombectomy.   Assessment & Plan:   Principal Problem:   Chest pain Active Problems:   COPD (chronic obstructive pulmonary disease) (HCC)   Hypothyroidism   Hypertension  Acute lower extremity DVT Patient had marked elevated D-dimer Right lower extremity duplex confirms extensive acute DVT Heparin GTT initiated Vascular surgery consult called for evaluation for surgical thrombectomy surgical thrombectomy  02/03/2020 Plan: Per vascular note plan to transition from heparin to Eliquis, order not changed yet Postoperative care PT/OT/OOB Monitor for bleeding  Acute choledocholithiasis Patient has stone in common bile duct with 8 mm CBD on MRCP GI consulted, currently no emergent indication for ERCP and there is no ERCP availability at [facility] this week No emergent need for ERCP Monitor daily liver function If patient develops any signs of acute cholangitis will likely require transfer to [facility] for advanced endoscopic procedure Defer antibiotics for now  Hypothyroidism Continue home Synthroid  Hypertension Currently on no medications Pressure controlled Vitals per unit protocol  COPD Stable, no evidence of exacerbation Will continue home regimen of albuterol and Singulair   DVT prophylaxis: Heparin GTT Code Status: DNR Family Communication: Spoke with caregiver/MPOA phone, 02/03/2020 Disposition Plan: Unclear at this time, home with home health versus skilled nursing facility  Consultants:   [place]  Procedures:  none  Antimicrobials:   none   Subjective: Seen and examined Not endorsing right upper quadrant pain this morning Nontoxic in appearance Alert oriented x2  Objective: Vitals:   01/17/2020 1748 01/20/2020 2006 02/05/20 0355 02/05/20 0846  BP: (!) 146/80 (!) 159/73 (!) 131/58 (!) 159/73  Pulse: 71 78 74 75  Resp: 16 17 15    Temp: 98.4 F (36.9 C) 97.8 F (36.6 C) (!) 97.5 F (36.4 C) 98.7 F (37.1 C)  TempSrc:  Oral Oral Oral  SpO2: 99% 97% 97% 97%  Weight:   79 kg   Height:        Intake/Output Summary (Last 24 hours) at 02/05/2020 1128 Last data filed at 02/05/2020 0700 Gross per 24 hour  Intake 1334 ml  Output 1855 ml  Net -521 ml   Filed Weights   01/27/2020 1629 01/13/2020 1447 02/05/20 0355  Weight: 76.6 kg 76.6 kg 79 kg    Examination:  General exam: Appears calm and comfortable  Respiratory system: Clear to auscultation.  Respiratory effort normal. Cardiovascular system: S1 & S2 heard, RRR. No JVD, murmurs, rubs, gallops or clicks. No pedal edema. Gastrointestinal system: Tender to palpation right upper quadrant, none distended, normal bowel sounds Central nervous system: Alert and oriented. No focal neurological deficits. Extremities: Symmetric 5 x 5 power. Skin: No rashes, lesions or ulcers Psychiatry: Judgement and insight appear normal. Mood & affect appropriate.     Data Reviewed: I have personally reviewed following labs and imaging studies  CBC: Recent Labs  Lab 02/01/2020 1114 02/03/20 0658 01/30/2020 0828 02/05/20 0321  WBC 7.8 5.6 4.7 6.2  NEUTROABS  --  3.7 3.5 4.5  HGB 13.6 11.9* 12.2* 10.0*  HCT 42.4 36.8* 38.2* 31.3*  MCV 92.4 91.1 91.6 91.8  PLT 131* 108* 90* 99*   Basic Metabolic Panel: Recent Labs  Lab 02/06/2020 1114 02/03/20 0658 01/31/2020 0828 02/05/20 0321  NA 140 141 142 141  K 4.9 3.9 4.0 3.7  CL 107 110 109 111  CO2 22 24 24 23   GLUCOSE 106* 98 105* 99  BUN 40* 36* 29* 25*  CREATININE 1.92* 1.64* 1.47* 1.39*  CALCIUM 9.5 9.1 8.8* 8.4*  MG  --  2.0  --   --    GFR: Estimated Creatinine Clearance: 45 mL/min (A) (by C-G formula based on SCr of 1.39 mg/dL (H)). Liver Function Tests: Recent Labs  Lab 01/22/2020 1114 02/03/20 0658 01/30/2020 0828 02/05/20 0321  AST 39 24 29 25   ALT 27 19 21 18   ALKPHOS 68 57 56 49  BILITOT 1.6* 1.2 0.9 1.0  PROT 7.9 7.1 7.0 6.2*  ALBUMIN 4.3 3.6 3.7 3.3*   Recent Labs  Lab 02/03/20 0658  LIPASE 40   Recent Labs  Lab 02/03/20 0658  AMMONIA 25   Coagulation Profile: Recent Labs  Lab 02/03/20 0658 02/03/20 1040  INR 1.2 1.1   Cardiac Enzymes: No results for input(s): CKTOTAL, CKMB, CKMBINDEX, TROPONINI in the last 168 hours. BNP (last 3 results) No results for input(s): PROBNP in the last 8760 hours. HbA1C: Recent Labs    02/04/2020 1656  HGBA1C 5.4   CBG: No results for input(s): GLUCAP in the last 168  hours. Lipid Profile: Recent Labs    01/19/2020 1656  CHOL 180  HDL 39*  LDLCALC 122*  TRIG 93  CHOLHDL 4.6   Thyroid Function Tests: Recent Labs    02/05/2020 1656  TSH 0.336*  FREET4 1.36*   Anemia Panel: No results for input(s): VITAMINB12, FOLATE, FERRITIN, TIBC, IRON, RETICCTPCT in the last 72 hours. Sepsis Labs: No results for input(s): PROCALCITON, LATICACIDVEN in the last 168 hours.  Recent Results (from the past 240 hour(s))  SARS CORONAVIRUS 2 (TAT 6-24 HRS) Nasopharyngeal Nasopharyngeal Swab     Status: None   Collection Time: 01/25/2020  4:03 PM   Specimen: Nasopharyngeal Swab  Result Value Ref Range Status   SARS Coronavirus 2 NEGATIVE NEGATIVE Final    Comment: (NOTE) SARS-CoV-2 target nucleic acids are NOT DETECTED. The SARS-CoV-2 RNA is generally detectable in upper and lower respiratory specimens during the acute phase of infection. Negative results do not preclude SARS-CoV-2 infection, do not rule out co-infections with other pathogens, and should not be used as the sole  basis for treatment or other patient management decisions. Negative results must be combined with clinical observations, patient history, and epidemiological information. The expected result is Negative. Fact Sheet for Patients: SugarRoll.be Fact Sheet for Healthcare Providers: https://www.woods-mathews.com/ This test is not yet approved or cleared by the [place] FDA and  has been authorized for detection and/or diagnosis of SARS-CoV-2 by FDA under an Emergency Use Authorization (EUA). This EUA will remain  in effect (meaning this test can be used) for the duration of the COVID-19 declaration under Section 56 4(b)(1) of the Act, 21 U.S.C. section 360bbb-3(b)(1), unless the authorization is terminated or revoked sooner. Performed at [facility], [place] [address], [place], [place] [address]   Culture, blood (Routine X 2) w Reflex to ID  Panel     Status: None (Preliminary result)   Collection Time: 02/05/2020  4:56 PM   Specimen: BLOOD  Result Value Ref Range Status   Specimen Description BLOOD BLOOD RIGHT HAND  Final   Special Requests   Final    BOTTLES DRAWN AEROBIC AND ANAEROBIC Blood Culture adequate volume   Culture   Final    NO GROWTH 3 DAYS Performed at [facility], [address], [place], [place] [address]    Report Status PENDING  Incomplete  Culture, blood (Routine X 2) w Reflex to ID Panel     Status: None (Preliminary result)   Collection Time: 01/21/2020  4:56 PM   Specimen: BLOOD  Result Value Ref Range Status   Specimen Description BLOOD BLOOD LEFT HAND  Final   Special Requests   Final    BOTTLES DRAWN AEROBIC AND ANAEROBIC Blood Culture adequate volume   Culture   Final    NO GROWTH 3 DAYS Performed at [facility], [address], [place], [place] [address]    Report Status PENDING  Incomplete  Urine Culture     Status: Abnormal   Collection Time: 02/05/2020 11:07 PM   Specimen: Urine, Random  Result Value Ref Range Status   Specimen Description   Final    URINE, RANDOM Performed at [facility], [address] [place], [place] [address]    Special Requests   Final    NONE Performed at [facility], [address], [place], [place] [address]    Culture (A)  Final    <10,000 COLONIES/mL INSIGNIFICANT GROWTH Performed at [facility], [place] [address], [place], [place] [address]    Report Status 02/03/2020 FINAL  Final         Radiology Studies: PERIPHERAL VASCULAR CATHETERIZATION  Result Date: 01/20/2020 See op note  US Venous Img Lower Unilateral Right (DVT)  Result Date: 02/03/2020 CLINICAL DATA:  Right lower extremity pain for the past several days. History of smoking. Evaluate for DVT. EXAM: RIGHT LOWER EXTREMITY VENOUS DOPPLER ULTRASOUND TECHNIQUE: Gray-scale sonography with graded compression, as well as color Doppler and duplex  ultrasound were performed to evaluate the lower extremity deep venous systems from the level of the common femoral vein and including the common femoral, femoral, profunda femoral, popliteal and calf veins including the posterior tibial, peroneal and gastrocnemius veins when visible. The superficial great saphenous vein was also interrogated. Spectral Doppler was utilized to evaluate flow at rest and with distal augmentation maneuvers in the common femoral, femoral and popliteal veins. COMPARISON:  None. FINDINGS: Contralateral Common Femoral Vein: Respiratory phasicity is normal and symmetric with the symptomatic side. No evidence of thrombus. Normal compressibility. The examination  is positive for extensive occlusive DVT extending from the right common femoral vein (image 5) into the imaged portions of the right deep femoral vein (image 13). Nonocclusive thrombus is seen within the saphenofemoral junction (image 11). There is hypoechoic occlusive thrombus involving the proximal (image 18), mid (image 21) and distal (image 26) aspects of the right femoral vein, extending to involve the popliteal vein (image 31). Nonocclusive thrombus seen within the saphenofemoral junction (image 11). Hypoechoic occlusive thrombus involving both paired posterior tibial (image 39) and peroneal veins (image 42). IMPRESSION: The examination is positive for extensive occlusive DVT involving near the entirety of the right lower extremity venous system Electronically Signed   By: Sandi Mariscal M.D.   On: 02/03/2020 11:55        Scheduled Meds: . benzonatate  100 mg Oral TID  . levothyroxine  75 mcg Oral QAC breakfast  . pantoprazole (PROTONIX) IV  40 mg Intravenous Q12H   Continuous Infusions: . heparin 700 Units/hr (01/25/2020 1922)     LOS: 3 days    Time spent: 35 minutes    Sidney Ace, MD [facility] Pager [phone]  If 7PM-7AM, please contact night-coverage 02/05/2020, 11:28 AM

## 2020-02-05 NOTE — Consult Note (Signed)
ANTICOAGULATION CONSULT NOTE - Initial Consult  Pharmacy Consult for heparin drip Indication: DVT/VTE suspected (elevated D-Dimer > 10,000 and leg swelling/warmth pending ultrasound)  Allergies  Allergen Reactions  . Allopurinol Swelling  . Lasix [Furosemide]     Patient Measurements: Height: 6\' 2"  (188 cm) Weight: 174 lb 3.2 oz (79 kg) IBW/kg (Calculated) : 82.2 Heparin Dosing Weight: 76.6kg  Vital Signs: Temp: 97.5 F (36.4 C) (02/26 0355) Temp Source: Oral (02/26 0355) BP: 131/58 (02/26 0355) Pulse Rate: 74 (02/26 0355)  Labs: Recent Labs    01/30/2020 1114 02/03/2020 1847 01/15/2020 2042 01/31/2020 2254 02/03/20 [id number] 02/03/20 0658 02/03/20 1040 02/03/20 2036 02/03/20 2221 02/04/20 0828 02/05/20 0321  HGB   < >  --   --   --  11.9*   < >  --   --   --  12.2* 10.0*  HCT   < >  --   --   --  36.8*  --   --   --   --  38.2* 31.3*  PLT   < >  --   --   --  108*  --   --   --   --  90* 99*  APTT  --   --   --   --   --   --  40*  --   --   --   --   LABPROT  --   --   --   --  14.6  --  14.2  --   --   --   --   INR  --   --   --   --  1.2  --  1.1  --   --   --   --   HEPARINUNFRC  --   --   --   --   --   --   --    < > 1.57* 1.06* 0.56  CREATININE   < >  --   --   --  1.64*  --   --   --   --  1.47* 1.39*  TROPONINIHS  --  7 7 8   --   --   --   --   --   --   --    < > = values in this interval not displayed.    Estimated Creatinine Clearance: 45 mL/min (A) (by C-G formula based on SCr of 1.39 mg/dL (H)).   Medical History: Past Medical History:  Diagnosis Date  . Asthma   . BPH (benign prostatic hyperplasia)   . COPD (chronic obstructive pulmonary disease) ([place])   . Diverticulosis   . GERD (gastroesophageal reflux disease)   . Gout   . Gout   . History of kidney stones   . Hypertension   . Hypothyroidism   . Klinefelter syndrome   . Nephrolithiasis   . [facility] spotted fever    history of....  . Shingles   . Sleep apnea    NO CPAP  . Stroke  [facility])    TIA    Medications:  No pta anticoagulant of record.  Pt was on SQH prior to start of drip - last dose 5000 units sq @ 0512 0224.  Assessment: Pharmacy has been consulted to initiate and monitor heparin drip in this 86 yo male with elevated D-Dimer and leg swelling pending ultrasound.  Dosing started @ 1250 uints/hr  2/24 @ 2221 HL = 1.57 dose held x 1 hour, reduced  to 900 units/hr 2/25 @ 0828 HL = 1.06  Goal of Therapy:  Heparin level 0.3-0.7 units/ml Monitor platelets by anticoagulation protocol: Yes   Plan:  02/26 @ 0330 HL 0.56 therapeutic. Will continue current rate and will recheck HL at 1130, CBC trending down, will continue to monitor.  Tobie Lords, PharmD, BCPS Clinical Pharmacist 02/05/2020 4:08 AM

## 2020-02-05 NOTE — Care Management Important Message (Signed)
Important Message  Patient Details  Name: Richard Fry MRN: [id number] Date of Birth: 1934-07-16   Medicare Important Message Given:  Yes     Dannette Barbara 02/05/2020, 11:16 AM

## 2020-02-05 NOTE — Evaluation (Signed)
Occupational Therapy Evaluation Patient Details Name: Richard Fry MRN: [id number] DOB: [birth year] Today's Date: 02/05/2020    History of Present Illness Richard Fry is a 86 y.o. male with medical history significant of HTN,hypothyroidism, RMSF seen in ED for chest pain, found to have RLE DVT and s/p surgical thrombectomy 02/05/20.  Pt with deficits in occupational participation secondary to delirium/decreased cognition and generalized weakness   Clinical Impression   Pt seen for OT evaluation in context of recent chest pain and delirium.  Pt presents to OT confused, disoriented, and drowsy.  Pt is unreliable historian, and occupational profile was gathered from pt and PT report.  Pt's neighbor provides significant assistance for the pt and his wife, for ADL and household management.  It appears that pt's cognitive status is not far from his baseline level of function.  OOB activity deferred today 2/2 pt confusion and drowsiness.  Pt presents with general UE weakness, and will benefit from continued inpatient OT services to address cognition and functional strength.  Recommend home health OT with 24 hour supervision at d/c at this time.    Follow Up Recommendations  Home health OT;Supervision/Assistance - 24 hour    Equipment Recommendations       Recommendations for Other Services       Precautions / Restrictions Precautions Precautions: Fall Restrictions Weight Bearing Restrictions: No      Mobility Bed Mobility                  Transfers Overall transfer level: Needs assistance                    Balance Overall balance assessment: Needs assistance                                         ADL either performed or assessed with clinical judgement   ADL Overall ADL's : Needs assistance/impaired                                       General ADL Comments: pt requires assistance for most ADL and IADL at baseline     Vision  Patient Visual Report: No change from baseline Vision Assessment?: No apparent visual deficits     Perception     Praxis      Pertinent Vitals/Pain Pain Assessment: No/denies pain     Hand Dominance     Extremity/Trunk Assessment Upper Extremity Assessment Upper Extremity Assessment: Generalized weakness   Lower Extremity Assessment Lower Extremity Assessment: Defer to PT evaluation       Communication     Cognition Arousal/Alertness: Lethargic Behavior During Therapy: (confused, drowsy) Overall Cognitive Status: History of cognitive impairments - at baseline                                 General Comments: per collateral reports, pt is roughly at his baseline level of cognition. Pt is disoriented and was unable to correctly name day of week (answered Tuesday), month (wasn't sure), year (answered 2012), and president (answered [place])   General Comments       Exercises Other Exercises Other Exercises: educated pt on safety and fall precautions, self care, and OT role and POC   Shoulder Instructions  Home Living Family/patient expects to be discharged to:: Private residence Living Arrangements: Spouse/significant other Available Help at Discharge: Neighbor Type of Home: House Home Access: Stairs to enter     [place]: One level     Bathroom Shower/Tub: Walk-in shower             Additional Comments: Pt presents confused during evaluation, difficult to assess living situation.  Per PT report, pt lives in a one story home with his wife.  Their neighbor checks in daily and provides assistance for many ADL and IADL for the two of them.  Pt reports having a walk in shower with a bench and grab bars (needs confirmation).      Prior Functioning/Environment Level of Independence: Needs assistance  Gait / Transfers Assistance Needed: no OOB activity assessed 2/2 pt fatigue and confusion ADL's / Homemaking Assistance Needed: pt's neighbor  provides heavy assistance for ADL and homemaking            OT Problem List: Decreased strength;Decreased activity tolerance;Impaired balance (sitting and/or standing);Decreased cognition;Decreased safety awareness;Decreased knowledge of use of DME or AE;Decreased knowledge of precautions      OT Treatment/Interventions: Self-care/ADL training;Therapeutic exercise;DME and/or AE instruction;Therapeutic activities;Cognitive remediation/compensation;Patient/family education;Balance training    OT Goals(Current goals can be found in the care plan section) Acute Rehab OT Goals Patient Stated Goal: "to get back to my tractor trailer" OT Goal Formulation: With patient Time For Goal Achievement: 02/19/20 Potential to Achieve Goals: Fair ADL Goals Pt Will Perform Lower Body Dressing: with min assist;sit to/from stand(with AE PRN and LRAD) Pt Will Transfer to Toilet: with min guard assist;ambulating(with LRAD) Additional ADL Goal #1: Pt will verbalize x3 strategies to prevent fall risk with min assist for improved safety and independence with ADL Additional ADL Goal #2: Pt will utilize 3 strategies for increased memory and orientation with min assist for improved cognition and safety.  OT Frequency: Min 1X/week   Barriers to D/C: Decreased caregiver support  pt's neighbor serves as good support for pt and pt's wife, pt's wife is unable to provide caregiver support       Co-evaluation              AM-PAC OT "6 Clicks" Daily Activity     Outcome Measure Help from another person eating meals?: A Little Help from another person taking care of personal grooming?: A Little Help from another person toileting, which includes using toliet, bedpan, or urinal?: A Little Help from another person bathing (including washing, rinsing, drying)?: A Little Help from another person to put on and taking off regular upper body clothing?: None Help from another person to put on and taking off regular  lower body clothing?: A Little 6 Click Score: 19   End of Session    Activity Tolerance: Patient limited by fatigue Patient left: in bed;with call bell/phone within reach;with bed alarm set  OT Visit Diagnosis: Muscle weakness (generalized) (M62.81);Other symptoms and signs involving cognitive function;Other abnormalities of gait and mobility (R26.89)                Time: [id number] OT Time Calculation (min): 16 min Charges:  OT General Charges $OT Visit: 1 Visit OT Evaluation $OT Eval Moderate Complexity: 1 Mod OT Treatments $Cognitive Funtion inital: Initial 15 mins  Myrtie Hawk, OTR/L 02/05/20, 4:14 PM

## 2020-02-05 NOTE — Progress Notes (Addendum)
[facility] Daily Progress Note   Subjective: 01/23/2020: 1. US guidance for vascular access to the right popliteal vein 2. Catheter placement into right common iliac vein from right popliteal approach 3. IVC gram and right lower extremity venogram 4.   Catheter directed thrombolysis with 6 mg TPA delivered through the right iliac veins, common femoral vein, and superficial femoral vein  5. Mechanical thrombectomy to right popliteal vein, superficial femoral vein, common femoral vein, external and common iliac veins with the penumbra cat 12 device 6. PTA of the common femoral vein with 10 mm balloon 7. PTA of right common iliac vein with 10 and 14 mm balloons  Patient without complaint this AM.  Objective: Vitals:   01/22/2020 1748 01/17/2020 2006 02/05/20 0355 02/05/20 0846  BP: (!) 146/80 (!) 159/73 (!) 131/58 (!) 159/73  Pulse: 71 78 74 75  Resp: 16 17 15    Temp: 98.4 F (36.9 C) 97.8 F (36.6 C) (!) 97.5 F (36.4 C) 98.7 F (37.1 C)  TempSrc:  Oral Oral Oral  SpO2: 99% 97% 97% 97%  Weight:   79 kg   Height:        Intake/Output Summary (Last 24 hours) at 02/05/2020 1109 Last data filed at 02/05/2020 0700 Gross per 24 hour  Intake 1334 ml  Output 1855 ml  Net -521 ml   Physical Exam: A&Ox3, NAD CV: RRR Pulmonary: CTA Bilaterally Abdomen: Soft, Nontender, Nondistended Vascular:  Right Lower Extremity: Compression wrap removed. Thigh soft. Calf soft. Extremity is warm distally.   Media Information   Document Information Photos    01/11/2020 16:22  Attached To:  Hospital Encounter on 01/25/2020  Source Information Leonides Schanz  [facility]   Laboratory: CBC    Component Value Date/Time   WBC 6.2 02/05/2020 0321   HGB 10.0 (L) 02/05/2020 0321   HGB 13.1 10/06/2013 1103   HCT 31.3 (L) 02/05/2020 0321   HCT 38.7 (L) 10/06/2013 1103   PLT 99 (L) 02/05/2020 0321   PLT 81 (L) 10/06/2013 1103   BMET    Component  Value Date/Time   NA 141 02/05/2020 0321   K 3.7 02/05/2020 0321   CL 111 02/05/2020 0321   CO2 23 02/05/2020 0321   GLUCOSE 99 02/05/2020 0321   BUN 25 (H) 02/05/2020 0321   CREATININE 1.39 (H) 02/05/2020 0321   CALCIUM 8.4 (L) 02/05/2020 0321   GFRNONAA 47 (L) 02/05/2020 0321   GFRAA 54 (L) 02/05/2020 0321   Assessment/Planning: The patient is an 86 year old male with an extensive right lower extremity DVT status post venous thrombectomy / venous lysis - POD#1  1) Compression dressing removed 2) Transition to Eliquis from Heparin 3) OOB / Ambulation 4) Will continue to follow in the outpatient setting  Discussed with Dr. Ellis Parents [facility] PA-C 02/05/2020 11:09 AM

## 2020-02-05 NOTE — Evaluation (Signed)
Physical Therapy Evaluation Patient Details Name: Richard Fry MRN: [id number] DOB: 05-Dec-1934 Today's Date: 02/05/2020   History of Present Illness  Richard Fry is a 86 y.o. male with medical history significant of HTN,hypothyroidism, RMSF seen in ED for chest pain, found to have RLE DVT and s/p surgical thrombectomy 02/05/20.  Also delirium/decreased cognition and generalized weakness  Clinical Impression  Pt did not readily open eyes t/o session, but was able to follow most instructions and ultimately showed the ability to get to EOB, standing and circumambulate the nurses' station w/o physical assist.  However he was confused and at times impulsive and needed consistent encouragement and directional cuing to insure safety during prolonged bout of ambulation. O2 (on room air) remained in the 90s and HR relatively stable 80-90s.  Pt endorses some fatigue but ultimately reports feeling close to his baseline.  He has some inherent safety issues 2/2 confusion but in speaking to caregiver (neighbor Richard Fry) after the session she corroborates that per my description he is probably at/near his baseline.  Pt did not endorse any pain in R LE (or else where) and showed good confidence despite his lethargy.  PT would benefit from STR, caregiver checks in QD, and PT encouraged her to insure plenty of supervision on initially returning home.    Follow Up Recommendations Home health PT;Supervision - Intermittent    Equipment Recommendations  None recommended by PT    Recommendations for Other Services       Precautions / Restrictions Precautions Precautions: Fall Restrictions Weight Bearing Restrictions: No      Mobility  Bed Mobility Overal bed mobility: Modified Independent             General bed mobility comments: Pt able to get himself to EOB w/o assist, consistently leaning back while sitting EOB but able to adjust with cuing and no direct assist  Transfers Overall transfer level: Needs  assistance Equipment used: Rolling walker (2 wheeled) Transfers: Sit to/from Stand Sit to Stand: Min guard         General transfer comment: cuing for safety, AD use, UE placement but physically able to rise w/o issue  Ambulation/Gait Ambulation/Gait assistance: Min guard Gait Distance (Feet): 200 Feet Assistive device: Rolling walker (2 wheeled)       General Gait Details: Pt with slow but consistent cadence.  O2 and HR remain stable t/o the effort; pt needs plenty of directional cuing but overall did well with (what he reports as baseline) shuffling gait  Stairs            Wheelchair Mobility    Modified Rankin (Stroke Patients Only)       Balance Overall balance assessment: Needs assistance Sitting-balance support: Feet supported Sitting balance-Leahy Scale: Fair Sitting balance - Comments: Pt consistently drifting backward in sitting, but able to transiently correct with cuing   Standing balance support: Bilateral upper extremity supported Standing balance-Leahy Scale: Fair Standing balance comment: pt reliant on walker in standing, but no LOBs or stagger steps                             Pertinent Vitals/Pain Pain Assessment: No/denies pain    Home Living Family/patient expects to be discharged to:: Private residence Living Arrangements: Spouse/significant other Available Help at Discharge: Neighbor Type of Home: House Home Access: Stairs to enter Entrance Stairs-Rails: (yes) Entrance Stairs-Number of Steps: 3 Home Layout: One level Home Equipment: Walker - 2 wheels;Cane - single  point Additional Comments: Pt presents confused during evaluation, difficult to assess living situation.  Per PT report, pt lives in a one story home with his wife.  Their neighbor checks in daily and provides assistance for many ADL and IADL for the two of them.  Pt reports having a walk in shower with a bench and grab bars (needs confirmation).    Prior Function  Level of Independence: Needs assistance   Gait / Transfers Assistance Needed: Pt able to walk to neighbor's home, independent in home mobility  ADL's / Homemaking Assistance Needed: pt's neighbor provides QD assistance for meds, meals, errands, etc        Hand Dominance        Extremity/Trunk Assessment   Upper Extremity Assessment Upper Extremity Assessment: Overall WFL for tasks assessed;Generalized weakness    Lower Extremity Assessment Lower Extremity Assessment: Overall WFL for tasks assessed;Generalized weakness       Communication      Cognition Arousal/Alertness: Lethargic Behavior During Therapy: Restless Overall Cognitive Status: History of cognitive impairments - at baseline                                 General Comments: inconsisetent, a lot of incoherent mumbling.  Only able to report name and birthday, unaware of date, location, etc      General Comments      Exercises General Exercises - Lower Extremity Ankle Circles/Pumps: AROM;10 reps;Seated Long Arc Quad: Strengthening;10 reps;Seated Heel Slides: Strengthening;10 reps;Seated Hip ABduction/ADduction: Strengthening;10 reps;Seated Hip Flexion/Marching: Strengthening;10 reps;Seated Other Exercises Other Exercises: educated pt on safety and fall precautions, self care, and OT role and POC   Assessment/Plan    PT Assessment Patient needs continued PT services  PT Problem List Decreased strength;Decreased activity tolerance;Decreased balance;Decreased mobility;Decreased cognition;Decreased knowledge of use of DME;Decreased safety awareness       PT Treatment Interventions DME instruction;Gait training;Stair training;Functional mobility training;Therapeutic activities;Therapeutic exercise;Balance training;Neuromuscular re-education;Cognitive remediation;Patient/family education    PT Goals (Current goals can be found in the Care Plan section)  Acute Rehab PT Goals Patient Stated  Goal: go home PT Goal Formulation: Patient unable to participate in goal setting Time For Goal Achievement: 02/19/20 Potential to Achieve Goals: Good    Frequency Min 2X/week   Barriers to discharge        Co-evaluation               AM-PAC PT "6 Clicks" Mobility  Outcome Measure Help needed turning from your back to your side while in a flat bed without using bedrails?: None Help needed moving from lying on your back to sitting on the side of a flat bed without using bedrails?: A Little Help needed moving to and from a bed to a chair (including a wheelchair)?: A Little Help needed standing up from a chair using your arms (e.g., wheelchair or bedside chair)?: A Little Help needed to walk in hospital room?: A Little Help needed climbing 3-5 steps with a railing? : A Little 6 Click Score: 19    End of Session Equipment Utilized During Treatment: Gait belt Activity Tolerance: Patient tolerated treatment well Patient left: with bed alarm set;with call bell/phone within reach Nurse Communication: Mobility status PT Visit Diagnosis: Muscle weakness (generalized) (M62.81);Difficulty in walking, not elsewhere classified (R26.2);Unsteadiness on feet (R26.81)    Time: [id number] PT Time Calculation (min) (ACUTE ONLY): 27 min   Charges:   PT Evaluation $PT Eval Low  Complexity: 1 Low PT Treatments $Gait Training: 8-22 mins        Kreg Shropshire, DPT 02/05/2020, 5:01 PM

## 2020-02-06 ENCOUNTER — Inpatient Hospital Stay: Payer: PPO

## 2020-02-06 LAB — CBC WITH DIFFERENTIAL/PLATELET
Abs Immature Granulocytes: 0.04 10*3/uL (ref 0.00–0.07)
Basophils Absolute: 0 10*3/uL (ref 0.0–0.1)
Basophils Relative: 1 %
Eosinophils Absolute: 0.1 10*3/uL (ref 0.0–0.5)
Eosinophils Relative: 2 %
HCT: 33 % — ABNORMAL LOW (ref 39.0–52.0)
Hemoglobin: 10.6 g/dL — ABNORMAL LOW (ref 13.0–17.0)
Immature Granulocytes: 1 %
Lymphocytes Relative: 12 %
Lymphs Abs: 0.7 10*3/uL (ref 0.7–4.0)
MCH: 28.8 pg (ref 26.0–34.0)
MCHC: 32.1 g/dL (ref 30.0–36.0)
MCV: 89.7 fL (ref 80.0–100.0)
Monocytes Absolute: 0.6 10*3/uL (ref 0.1–1.0)
Monocytes Relative: 9 %
Neutro Abs: 4.5 10*3/uL (ref 1.7–7.7)
Neutrophils Relative %: 75 %
Platelets: 129 10*3/uL — ABNORMAL LOW (ref 150–400)
RBC: 3.68 MIL/uL — ABNORMAL LOW (ref 4.22–5.81)
RDW: 13.3 % (ref 11.5–15.5)
WBC: 6 10*3/uL (ref 4.0–10.5)
nRBC: 0 % (ref 0.0–0.2)

## 2020-02-06 LAB — COMPREHENSIVE METABOLIC PANEL
ALT: 19 U/L (ref 0–44)
AST: 34 U/L (ref 15–41)
Albumin: 3.5 g/dL (ref 3.5–5.0)
Alkaline Phosphatase: 46 U/L (ref 38–126)
Anion gap: 9 (ref 5–15)
BUN: 20 mg/dL (ref 8–23)
CO2: 24 mmol/L (ref 22–32)
Calcium: 9 mg/dL (ref 8.9–10.3)
Chloride: 109 mmol/L (ref 98–111)
Creatinine, Ser: 1.38 mg/dL — ABNORMAL HIGH (ref 0.61–1.24)
GFR calc Af Amer: 54 mL/min — ABNORMAL LOW (ref >60)
GFR calc non Af Amer: 47 mL/min — ABNORMAL LOW (ref >60)
Glucose, Bld: 85 mg/dL (ref 70–99)
Potassium: 3.5 mmol/L (ref 3.5–5.1)
Sodium: 142 mmol/L (ref 135–145)
Total Bilirubin: 1.3 mg/dL — ABNORMAL HIGH (ref 0.3–1.2)
Total Protein: 6.4 g/dL — ABNORMAL LOW (ref 6.5–8.1)

## 2020-02-06 MED ORDER — PANTOPRAZOLE SODIUM 40 MG PO TBEC: 40.0000 mg | DELAYED_RELEASE_TABLET | Freq: Every day | ORAL | Status: DC

## 2020-02-06 MED ORDER — PANTOPRAZOLE SODIUM 40 MG PO TBEC: 40.0000 mg | DELAYED_RELEASE_TABLET | Freq: Two times a day (BID) | ORAL | Status: DC

## 2020-02-06 NOTE — Progress Notes (Addendum)
Pt's MEWs score is 2.  Assessed patient-  He is asleep, he responds to voice but will not wake up to take medication.  Pt RR is 22 (open mouth breathing.  Apical rate 90.  Will continue to monitor

## 2020-02-06 NOTE — Plan of Care (Signed)
VSS. Falls precautions maintained. Denies any pain. Oriented to self only. R popliteal site c/d/I and soft. Assessment as documented. Discharge pending home concerns. POC attempted, pt shows no evidence of learning.   Problem: Education: Goal: Knowledge of General Education information will improve Description: Including pain rating scale, medication(s)/side effects and non-pharmacologic comfort measures Outcome: Progressing   Problem: Health Behavior/Discharge Planning: Goal: Ability to manage health-related needs will improve Outcome: Progressing   Problem: Clinical Measurements: Goal: Ability to maintain clinical measurements within normal limits will improve Outcome: Progressing Goal: Will remain free from infection Outcome: Progressing Goal: Diagnostic test results will improve Outcome: Progressing Goal: Respiratory complications will improve Outcome: Progressing Goal: Cardiovascular complication will be avoided Outcome: Progressing   Problem: Activity: Goal: Risk for activity intolerance will decrease Outcome: Progressing   Problem: Nutrition: Goal: Adequate nutrition will be maintained Outcome: Progressing   Problem: Coping: Goal: Level of anxiety will decrease Outcome: Progressing   Problem: Elimination: Goal: Will not experience complications related to bowel motility Outcome: Progressing Goal: Will not experience complications related to urinary retention Outcome: Progressing   Problem: Pain Managment: Goal: General experience of comfort will improve Outcome: Progressing   Problem: Safety: Goal: Ability to remain free from injury will improve Outcome: Progressing   Problem: Skin Integrity: Goal: Risk for impaired skin integrity will decrease Outcome: Progressing

## 2020-02-06 NOTE — Progress Notes (Signed)
PHARMACIST - PHYSICIAN COMMUNICATION  DR:   Priscella Mann  CONCERNING: IV to Oral Route Change Policy  RECOMMENDATION: This patient is receiving pantoprazole by the intravenous route.  Based on criteria approved by the Pharmacy and Therapeutics Committee, the intravenous medication(s) is/are being converted to the equivalent oral dose form(s).   DESCRIPTION: These criteria include:  The patient is eating (either orally or via tube) and/or has been taking other orally administered medications for a least 24 hours  The patient has no evidence of active gastrointestinal bleeding or impaired GI absorption (gastrectomy, short bowel, patient on TNA or NPO).  If you have questions about this conversion, please contact the Pharmacy Department  []   [phone] )  Richard Fry [x]   [phone] )  Richard Fry []   [phone] )  Richard Fry []   [phone] )  Richard Fry []   [phone] )  Richard Fry, PharmD, BCPS Clinical Pharmacist 02/06/2020 8:23 AM

## 2020-02-06 NOTE — Progress Notes (Signed)
Patient had a bad coughing spell- had a hard time coughing up mucus.   Placed in semi-fowlers position (30-45 degrees) .  Airways sounded congested with some wheezing.  Gave breathing treatment and cough medication.   Lungs sounds sounded clearer post breathing treatment.

## 2020-02-06 NOTE — Progress Notes (Addendum)
Pt temp at 2236 at 99.1 at 2222 pt spike at 100.3. Notify prime. Will continue to monitor.  Update 2309: Talked to [facility] NP and states to monitor at this time. Will continue to monitor.  Update 0122: Pt temp at 101.3. Vallarie Mare NP.   Update 0514: pt Bp at 86/38 MAP 53. Notiy prime and talked to [place] NP and states will place order. Will continue to monitor.  Update 0502: Pt BP at 86/45 Map 58 after 250 bolus of LR. LR continous at 75 continous running now. Notify prime. Will continue to monitor.  Update 0522: Second 250 bolus was ordered by Randol Kern NP and was given. BP at 92/77 MAP 83 after the second bolus of LR. WBC at 14.5 from 6 initially.Notify [place] NP.  Update 0657Randol Kern NP ordered lactic acid STAT. Lab notified. Will continue to monitor.

## 2020-02-06 NOTE — Progress Notes (Signed)
PROGRESS NOTE    Richard Fry  [id number] DOB: 1934/10/05 DOA: 02/01/2020 PCP: Tracie Harrier, MD   Brief Narrative:  Richard Fry is a 86 y.o. male with medical history significant of htn,hypothyroidism,RMSF seen in ed for chest pain, which pt then points to ruq pain. HPIis per edmd note pt is altered with sitter at bedside, he is delirius and says he lives in [place]. Per caretaker, at PCP appt few weeks ago it showed liver enzymes were high in the 300's and had usg was done and repeat usg today shows dilated CBD.   2/24: Patient seen and examined.  Sitter at bedside.  Patient appears alert and is answering all questions appropriately.  MRCP findings noted.  Reached out to gastroenterology.  Currently no emergent indication for ERCP as LFTs are trending down patient is nontoxic and noncholangitic in appearance.  Markedly elevated D-dimer noted.  Right lower extremity Doppler ordered.  Patient has extensive clot burden in nearly all the vasculature within the right lower extremity.  2/25: Patient seen and examined.  Remains somewhat confused.  Liver function stable.  No signs of cholangitis.  Extensive DVT in right lower extremity.  Vascular consulted yesterday.  Plan for surgical thrombectomy/venous lysis today.  N.p.o. for procedure.  2/26: Patient seen and examined.  Apparently was agitated yesterday evening required administration of Haldol.  Somewhat altered this morning, trying to get out of bed.  Easily redirected.  Postop day #1 status post surgical thrombectomy.  2/27: Patient seen and examined.  No acute interval changes overnight.  Postoperative day 2 status post surgical thrombectomy.  Surgical right removed.  Started on Eliquis.   Assessment & Plan:   Principal Problem:   Chest pain Active Problems:   COPD (chronic obstructive pulmonary disease) (HCC)   Hypothyroidism   Hypertension  Acute lower extremity DVT Patient had marked elevated D-dimer Right lower extremity  duplex confirms extensive acute DVT Heparin GTT initiated Vascular surgery consult called for evaluation for surgical thrombectomy surgical thrombectomy 02/06/2020 Eliquis started per vascular surgeon recommendations Plan: Continue Eliquis, 10 mg twice daily x7 days, followed by 5 mg twice daily indefinitely or until directed otherwise by a outpatient physician Postoperative care PT/OT: Recommending home with home health, ordered Monitor for bleeding Plan for discharge in a.m. on 02/09/2020  Acute choledocholithiasis Patient has stone in common bile duct with 8 mm CBD on MRCP GI consulted, currently no emergent indication for ERCP and there is no ERCP availability at [facility] this week No emergent need for ERCP Monitor daily liver function If patient develops any signs of acute cholangitis will likely require transfer to [facility] for advanced endoscopic procedure LFTs to slight uptrend over interval, belly benign Currently no indication for emergent ERCP or empiric antibiotics  Hypothyroidism Continue home Synthroid  Hypertension Currently on no medications Pressure controlled Vitals per unit protocol  COPD Stable, no evidence of exacerbation Will continue home regimen of albuterol and Singulair   DVT prophylaxis: Heparin GTT Code Status: DNR Family Communication: Spoke with caregiver/MPOA phone, 02/06/2020 Disposition Plan: Home with home health.  Tentative plan to discharge on 02/09/20 if hemoglobin stable and LFTs stable versus downtrending Consultants:   GI - [place]  Vascular surgery-[place]  Procedures:  Surgical thrombectomy-01/30/2020  Antimicrobials:   none   Subjective: Seen and examined No pain complaints Nontoxic in appearance Alert oriented x2  Objective: Vitals:   02/05/20 2000 02/06/20 0426 02/06/20 0745 02/06/20 1151  BP: (!) 151/66 118/82 (!) 147/75 (!) 152/68  Pulse: 82 76  72 90  Resp: 20 20 18 18   Temp: 98.1 F (36.7 C) 98.3 F (36.8 C) 98.2  F (36.8 C) 98.4 F (36.9 C)  TempSrc: Oral Oral  Oral  SpO2: 96% 91% 97% 96%  Weight:  75.3 kg    Height:        Intake/Output Summary (Last 24 hours) at 02/06/2020 1151 Last data filed at 02/06/2020 1100 Gross per 24 hour  Intake --  Output 2175 ml  Net -2175 ml   Filed Weights   01/24/2020 1447 02/05/20 0355 02/06/20 0426  Weight: 76.6 kg 79 kg 75.3 kg    Examination:  General exam: Appears calm and comfortable  Respiratory system: Clear to auscultation. Respiratory effort normal. Cardiovascular system: S1 & S2 heard, RRR. No JVD, murmurs, rubs, gallops or clicks. No pedal edema. Gastrointestinal system: Tender to palpation right upper quadrant, none distended, normal bowel sounds Central nervous system: Alert and oriented. No focal neurological deficits. Extremities: Symmetric 5 x 5 power. Skin: No rashes, lesions or ulcers Psychiatry: Judgement and insight appear normal. Mood & affect appropriate.     Data Reviewed: I have personally reviewed following labs and imaging studies  CBC: Recent Labs  Lab 01/15/2020 1114 02/03/20 0658 01/24/2020 0828 02/05/20 0321 02/06/20 0451  WBC 7.8 5.6 4.7 6.2 6.0  NEUTROABS  --  3.7 3.5 4.5 4.5  HGB 13.6 11.9* 12.2* 10.0* 10.6*  HCT 42.4 36.8* 38.2* 31.3* 33.0*  MCV 92.4 91.1 91.6 91.8 89.7  PLT 131* 108* 90* 99* [id number]*   Basic Metabolic Panel: Recent Labs  Lab 01/17/2020 1114 02/03/20 0658 01/25/2020 0828 02/05/20 0321 02/06/20 0451  NA 140 141 142 141 142  K 4.9 3.9 4.0 3.7 3.5  CL 107 110 109 111 109  CO2 22 24 24 23 24   GLUCOSE 106* 98 105* 99 85  BUN 40* 36* 29* 25* 20  CREATININE 1.92* 1.64* 1.47* 1.39* 1.38*  CALCIUM 9.5 9.1 8.8* 8.4* 9.0  MG  --  2.0  --   --   --    GFR: Estimated Creatinine Clearance: 43.2 mL/min (A) (by C-G formula based on SCr of 1.38 mg/dL (H)). Liver Function Tests: Recent Labs  Lab 01/14/2020 1114 02/03/20 0658 01/16/2020 0828 02/05/20 0321 02/06/20 0451  AST 39 24 29 25  34  ALT 27 19  21 18 19   ALKPHOS 68 57 56 49 46  BILITOT 1.6* 1.2 0.9 1.0 1.3*  PROT 7.9 7.1 7.0 6.2* 6.4*  ALBUMIN 4.3 3.6 3.7 3.3* 3.5   Recent Labs  Lab 02/03/20 0658  LIPASE 40   Recent Labs  Lab 02/03/20 0658  AMMONIA 25   Coagulation Profile: Recent Labs  Lab 02/03/20 0658 02/03/20 1040  INR 1.2 1.1   Cardiac Enzymes: No results for input(s): CKTOTAL, CKMB, CKMBINDEX, TROPONINI in the last 168 hours. BNP (last 3 results) No results for input(s): PROBNP in the last 8760 hours. HbA1C: No results for input(s): HGBA1C in the last 72 hours. CBG: No results for input(s): GLUCAP in the last 168 hours. Lipid Profile: No results for input(s): CHOL, HDL, LDLCALC, TRIG, CHOLHDL, LDLDIRECT in the last 72 hours. Thyroid Function Tests: No results for input(s): TSH, T4TOTAL, FREET4, T3FREE, THYROIDAB in the last 72 hours. Anemia Panel: No results for input(s): VITAMINB12, FOLATE, FERRITIN, TIBC, IRON, RETICCTPCT in the last 72 hours. Sepsis Labs: No results for input(s): PROCALCITON, LATICACIDVEN in the last 168 hours.  Recent Results (from the past 240 hour(s))  SARS CORONAVIRUS 2 (TAT 6-24 HRS)  Nasopharyngeal Nasopharyngeal Swab     Status: None   Collection Time: 01/17/2020  4:03 PM   Specimen: Nasopharyngeal Swab  Result Value Ref Range Status   SARS Coronavirus 2 NEGATIVE NEGATIVE Final    Comment: (NOTE) SARS-CoV-2 target nucleic acids are NOT DETECTED. The SARS-CoV-2 RNA is generally detectable in upper and lower respiratory specimens during the acute phase of infection. Negative results do not preclude SARS-CoV-2 infection, do not rule out co-infections with other pathogens, and should not be used as the sole basis for treatment or other patient management decisions. Negative results must be combined with clinical observations, patient history, and epidemiological information. The expected result is Negative. Fact Sheet for  Patients: SugarRoll.be Fact Sheet for Healthcare Providers: https://www.woods-mathews.com/ This test is not yet approved or cleared by the [place] FDA and  has been authorized for detection and/or diagnosis of SARS-CoV-2 by FDA under an Emergency Use Authorization (EUA). This EUA will remain  in effect (meaning this test can be used) for the duration of the COVID-19 declaration under Section 56 4(b)(1) of the Act, 21 U.S.C. section 360bbb-3(b)(1), unless the authorization is terminated or revoked sooner. Performed at [facility], [place] [address] [place], [place] [address]   Culture, blood (Routine X 2) w Reflex to ID Panel     Status: None (Preliminary result)   Collection Time: 01/22/2020  4:56 PM   Specimen: BLOOD  Result Value Ref Range Status   Specimen Description BLOOD BLOOD RIGHT HAND  Final   Special Requests   Final    BOTTLES DRAWN AEROBIC AND ANAEROBIC Blood Culture adequate volume   Culture   Final    NO GROWTH 4 DAYS Performed at [facility], [address], [place], [place] [address]    Report Status PENDING  Incomplete  Culture, blood (Routine X 2) w Reflex to ID Panel     Status: None (Preliminary result)   Collection Time: 01/23/2020  4:56 PM   Specimen: BLOOD  Result Value Ref Range Status   Specimen Description BLOOD BLOOD LEFT HAND  Final   Special Requests   Final    BOTTLES DRAWN AEROBIC AND ANAEROBIC Blood Culture adequate volume   Culture   Final    NO GROWTH 4 DAYS Performed at [facility], [address], [place], [place] [address]    Report Status PENDING  Incomplete  Urine Culture     Status: Abnormal   Collection Time: 01/19/2020 11:07 PM   Specimen: Urine, Random  Result Value Ref Range Status   Specimen Description   Final    URINE, RANDOM Performed at [facility], [address] [place], [place] [address]    Special Requests   Final    NONE Performed at  [facility], [address] [place], [place] [address]    Culture (A)  Final    <10,000 COLONIES/mL INSIGNIFICANT GROWTH Performed at [facility], [place] [address], [place], [place] [address]    Report Status 02/03/2020 FINAL  Final         Radiology Studies: PERIPHERAL VASCULAR CATHETERIZATION  Result Date: 01/11/2020 See op note       Scheduled Meds: . apixaban  10 mg Oral BID   Followed by  . [START ON 02/12/2020] apixaban  5 mg Oral BID  . benzonatate  100 mg Oral TID  . levothyroxine  75 mcg Oral QAC breakfast  . [START ON 02-29-2020] pantoprazole  40 mg Oral Daily  Continuous Infusions:    LOS: 4 days    Time spent: 35 minutes    Sidney Ace, MD [facility] Pager [phone]  If 7PM-7AM, please contact night-coverage 02/06/2020, 11:51 AM

## 2020-02-07 ENCOUNTER — Inpatient Hospital Stay: Payer: PPO

## 2020-02-07 ENCOUNTER — Encounter: Payer: Self-pay | Admitting: Anesthesiology

## 2020-02-07 ENCOUNTER — Ambulatory Visit (HOSPITAL_COMMUNITY): Admission: AD | Admit: 2020-02-07 | Payer: PPO | Source: Other Acute Inpatient Hospital | Admitting: Gastroenterology

## 2020-02-07 DIAGNOSIS — K8309 Other cholangitis: Secondary | ICD-10-CM

## 2020-02-07 LAB — BLOOD CULTURE ID PANEL (REFLEXED)

## 2020-02-07 LAB — CBC
HCT: 35.8 % — ABNORMAL LOW (ref 39.0–52.0)
Hemoglobin: 11.8 g/dL — ABNORMAL LOW (ref 13.0–17.0)
MCH: 29.3 pg (ref 26.0–34.0)
MCHC: 33 g/dL (ref 30.0–36.0)
MCV: 88.8 fL (ref 80.0–100.0)
Platelets: 132 10*3/uL — ABNORMAL LOW (ref 150–400)
RBC: 4.03 MIL/uL — ABNORMAL LOW (ref 4.22–5.81)
RDW: 14.1 % (ref 11.5–15.5)
WBC: 14.5 10*3/uL — ABNORMAL HIGH (ref 4.0–10.5)
nRBC: 0.3 % — ABNORMAL HIGH (ref 0.0–0.2)

## 2020-02-07 LAB — LACTIC ACID, PLASMA
Lactic Acid, Venous: 4.4 mmol/L (ref 0.5–1.9)
Lactic Acid, Venous: 5.3 mmol/L (ref 0.5–1.9)

## 2020-02-07 LAB — COMPREHENSIVE METABOLIC PANEL
ALT: 195 U/L — ABNORMAL HIGH (ref 0–44)
AST: 305 U/L — ABNORMAL HIGH (ref 15–41)
Albumin: 3.2 g/dL — ABNORMAL LOW (ref 3.5–5.0)
Alkaline Phosphatase: 187 U/L — ABNORMAL HIGH (ref 38–126)
Anion gap: 14 (ref 5–15)
BUN: 35 mg/dL — ABNORMAL HIGH (ref 8–23)
CO2: 19 mmol/L — ABNORMAL LOW (ref 22–32)
Calcium: 9.2 mg/dL (ref 8.9–10.3)
Chloride: 107 mmol/L (ref 98–111)
Creatinine, Ser: 2.61 mg/dL — ABNORMAL HIGH (ref 0.61–1.24)
GFR calc Af Amer: 25 mL/min — ABNORMAL LOW (ref >60)
GFR calc non Af Amer: 22 mL/min — ABNORMAL LOW (ref >60)
Glucose, Bld: 115 mg/dL — ABNORMAL HIGH (ref 70–99)
Potassium: 3.5 mmol/L (ref 3.5–5.1)
Sodium: 140 mmol/L (ref 135–145)
Total Bilirubin: 4.9 mg/dL — ABNORMAL HIGH (ref 0.3–1.2)
Total Protein: 6.2 g/dL — ABNORMAL LOW (ref 6.5–8.1)

## 2020-02-07 LAB — CULTURE, BLOOD (ROUTINE X 2)
Culture: NO GROWTH
Culture: NO GROWTH
Special Requests: ADEQUATE
Special Requests: ADEQUATE

## 2020-02-07 LAB — MRSA PCR SCREENING: MRSA by PCR: NEGATIVE

## 2020-02-07 LAB — PROTIME-INR
INR: 3 — ABNORMAL HIGH (ref 0.8–1.2)
Prothrombin Time: 31.3 seconds — ABNORMAL HIGH (ref 11.4–15.2)

## 2020-02-07 LAB — HEPARIN LEVEL (UNFRACTIONATED): Heparin Unfractionated: >3.6 IU/mL — ABNORMAL HIGH (ref 0.30–0.70)

## 2020-02-07 LAB — APTT: aPTT: 36 seconds (ref 24–36)

## 2020-02-07 SURGERY — ENDOSCOPIC RETROGRADE CHOLANGIOPANCREATOGRAPHY (ERCP) WITH PROPOFOL
Anesthesia: General

## 2020-02-07 MED ORDER — APIXABAN 5 MG PO TABS: 10.0000 mg | ORAL_TABLET | Freq: Two times a day (BID) | ORAL | Status: AC

## 2020-02-07 MED ORDER — CHLORHEXIDINE GLUCONATE CLOTH 2 % EX PADS
6.0000 | MEDICATED_PAD | Freq: Every day | CUTANEOUS | Status: DC
  Administered 2020-02-07: 11:00:00 6 via TOPICAL

## 2020-02-07 MED ORDER — AMIODARONE LOAD VIA INFUSION
150.0000 mg | Freq: Once | INTRAVENOUS | Status: AC
  Administered 2020-02-07: 150 mg via INTRAVENOUS
  Filled 2020-02-07: qty 83.34

## 2020-02-07 MED ORDER — PIPERACILLIN-TAZOBACTAM 3.375 G IVPB: 3.3750 g | Freq: Two times a day (BID) | INTRAVENOUS | Status: AC

## 2020-02-07 MED ORDER — HEPARIN (PORCINE) 25000 UT/250ML-% IV SOLN: 750.0000 [IU]/h | INTRAVENOUS | Status: DC

## 2020-02-07 MED ORDER — AMIODARONE IV BOLUS ONLY 150 MG/100ML
INTRAVENOUS | Status: AC
  Administered 2020-02-07: 150 mg
  Filled 2020-02-07: qty 100

## 2020-02-07 MED ORDER — ACETAMINOPHEN 650 MG RE SUPP: 650.0000 mg | Freq: Four times a day (QID) | RECTAL | 0 refills | Status: AC | PRN

## 2020-02-07 MED ORDER — ACETAMINOPHEN 325 MG PO TABS: 650.0000 mg | ORAL_TABLET | Freq: Four times a day (QID) | ORAL | Status: AC | PRN

## 2020-02-07 MED ORDER — LACTATED RINGERS IV BOLUS
250.0000 mL | Freq: Once | INTRAVENOUS | Status: AC
  Administered 2020-02-07: 250 mL via INTRAVENOUS

## 2020-02-07 MED ORDER — AMIODARONE HCL IN DEXTROSE 360-4.14 MG/200ML-% IV SOLN: 30.0000 mg/h | INTRAVENOUS | Status: DC

## 2020-02-07 MED ORDER — APIXABAN 5 MG PO TABS: 5.0000 mg | ORAL_TABLET | Freq: Two times a day (BID) | ORAL | Status: AC

## 2020-02-07 MED ORDER — LACTATED RINGERS IV SOLN: INTRAVENOUS | Status: DC

## 2020-02-07 MED ORDER — MORPHINE SULFATE (PF) 2 MG/ML IV SOLN
INTRAVENOUS | Status: AC
  Administered 2020-02-07: 13:00:00 2 mg via INTRAVENOUS
  Filled 2020-02-07: qty 1

## 2020-02-07 MED ORDER — SODIUM CHLORIDE 0.9 % IV BOLUS
1000.0000 mL | Freq: Once | INTRAVENOUS | Status: AC
  Administered 2020-02-07: 1000 mL via INTRAVENOUS

## 2020-02-07 MED ORDER — SODIUM CHLORIDE 0.9 % IV BOLUS
1000.0000 mL | Freq: Once | INTRAVENOUS | Status: AC
  Administered 2020-02-07: 11:00:00 1000 mL via INTRAVENOUS

## 2020-02-07 MED ORDER — AMIODARONE HCL IN DEXTROSE 360-4.14 MG/200ML-% IV SOLN
60.0000 mg/h | INTRAVENOUS | Status: DC
  Administered 2020-02-07: 60 mg/h via INTRAVENOUS
  Filled 2020-02-07: qty 200

## 2020-02-07 MED ORDER — MORPHINE SULFATE (PF) 2 MG/ML IV SOLN: 2.0000 mg | Freq: Once | INTRAVENOUS | Status: DC

## 2020-02-07 MED ORDER — HEPARIN BOLUS VIA INFUSION
3700.0000 [IU] | Freq: Once | INTRAVENOUS | Status: DC
  Filled 2020-02-07: qty 3700

## 2020-02-07 MED ORDER — PIPERACILLIN-TAZOBACTAM 3.375 G IVPB
3.3750 g | Freq: Two times a day (BID) | INTRAVENOUS | Status: DC
  Administered 2020-02-07: 08:00:00 3.375 g via INTRAVENOUS
  Filled 2020-02-07 (×2): qty 50

## 2020-02-08 LAB — BLOOD GAS, VENOUS
Acid-base deficit: 5.9 mmol/L — ABNORMAL HIGH (ref 0.0–2.0)
Bicarbonate: 21.1 mmol/L (ref 20.0–28.0)
O2 Saturation: 11.2 %
Patient temperature: 37
pCO2, Ven: 46 mmHg (ref 44.0–60.0)
pH, Ven: 7.27 (ref 7.250–7.430)

## 2020-02-08 LAB — GLUCOSE, CAPILLARY: Glucose-Capillary: 90 mg/dL (ref 70–99)

## 2020-02-08 NOTE — Progress Notes (Addendum)
Patient expired at 1338. This nurse and Durene Fruits RN pronounced. MD at bedside as well. Caregiver updated on patient status and visited with patient. Belongings at bedside, caregiver does not want to take them. Funeral home per caregiver Denice [place] and [employer] [facility] home in [place]. Will call donor services.

## 2020-02-08 NOTE — Progress Notes (Addendum)
OVERNIGHT Patient with development of fever, MEWS 2 and sodft blood pressure Fever improved with tylenol Encouraged nurse to stress use of incentive spirometer or just taking deeper breaths if possible 250 cl LR bolus ordered for soft pressures and starting on maintenance fluids secodnary to reported very low/poor oral intake  Second bolus required to get blood pressure with MAP above 65, WBC this am 14.7, lactic acid ordered along with repeat blood cultures

## 2020-02-08 NOTE — Progress Notes (Signed)
PHARMACY - PHYSICIAN COMMUNICATION CRITICAL VALUE ALERT - BLOOD CULTURE IDENTIFICATION (BCID)  Richard Fry is an 86 y.o. male who presented to [facility] on (Not on file) with a chief complaint of afib (recent h/o DVT on Eliquis), septic shock and kidney impairment, respiratory deteriorated.  Assessment:  BCID positive for 3/4 anaerobic bottle x1 and aerobic bottle x2; enterobacteriaceae, E.coli. Patient passed away today @1338 .   Name of physician (or Provider) Contacted: None at this time   No results found for this or any previous visit.  Rowland Lathe February 15, 2020  5:07 PM

## 2020-02-08 NOTE — Consult Note (Signed)
CRITICAL CARE PROGRESS NOTE    Name: Richard Fry MRN: [id number] DOB: [birth year]     LOS: 5   SUBJECTIVE FINDINGS & SIGNIFICANT EVENTS   Patient description:  86 year old with history of hypothyroidism, confusion, hypertension secondary to sepsis due to intra-abdominal infection with possible ascending cholangitis status post MRCP.  With GI consultation plan for ERCP. he also has extensive right lower extremity DVT status post thrombectomy, as well as atrial fibrillation with rapid ventricular response on arrival to the intensive care unit.  Lines / Drains: PIV x2  Cultures / Sepsis markers: Blood cultures x2  Antibiotics: Zosyn   Protocols / Consultants: GI, pulmonary critical care, hospitalist   PAST MEDICAL HISTORY   Past Medical History:  Diagnosis Date  . Asthma   . BPH (benign prostatic hyperplasia)   . COPD (chronic obstructive pulmonary disease) ([place])   . Diverticulosis   . GERD (gastroesophageal reflux disease)   . Gout   . Gout   . History of kidney stones   . Hypertension   . Hypothyroidism   . Klinefelter syndrome   . Nephrolithiasis   . [facility] spotted fever    history of....  . Shingles   . Sleep apnea    NO CPAP  . Stroke [facility])    TIA     SURGICAL HISTORY   Past Surgical History:  Procedure Laterality Date  . CATARACT EXTRACTION W/PHACO Left 08/13/2017   Procedure: CATARACT EXTRACTION PHACO AND INTRAOCULAR LENS PLACEMENT (IOC);  Surgeon: Birder Robson, MD;  Location: [facility] ORS;  Service: Ophthalmology;  Laterality: Left;  [place] 00:42.8 AP% 13.1 CDE 5.60 FLUID PACK LOT # [id number]  . CATARACT EXTRACTION W/PHACO Right 09/24/2017   Procedure: CATARACT EXTRACTION PHACO AND INTRAOCULAR LENS PLACEMENT (IOC);  Surgeon: Birder Robson, MD;  Location: [facility] ORS;   Service: Ophthalmology;  Laterality: Right;  [place] 00:42 AP% 19.4 CDE 8.23 Fluid pack lot # [id number]  . JOINT REPLACEMENT     LEFT THR/  BIL TKR  . JOINT REPLACEMENT Bilateral    TKR  . PERIPHERAL VASCULAR THROMBECTOMY Right 02/06/2020   Procedure: Right Lower Extremity Venous Thrombectomy / Lysis;  Surgeon: Algernon Huxley, MD;  Location: [place] [facility];  Service: Cardiovascular;  Laterality: Right;     FAMILY HISTORY   History reviewed. No pertinent family history.   SOCIAL HISTORY   Social History   Tobacco Use  . Smoking status: Former Smoker    Types: Cigarettes  . Smokeless tobacco: Never Used  Substance Use Topics  . Alcohol use: No  . Drug use: No     MEDICATIONS   Current Medication:  Current Facility-Administered Medications:  .  acetaminophen (TYLENOL) tablet 650 mg, 650 mg, Oral, Q6H PRN, 650 mg at 01/13/2020 2204 **OR** acetaminophen (TYLENOL) suppository 650 mg, 650 mg, Rectal, Q6H PRN, Algernon Huxley, MD, 650 mg at 02/13/20 0124 .  albuterol (PROVENTIL) (2.5 MG/3ML) 0.083% nebulizer solution 3 mL, 3 mL, Inhalation, Q6H PRN, Algernon Huxley, MD, 3 mL at 02/06/20 1510 .  amiodarone (NEXTERONE) 1.8 mg/mL load via infusion 150 mg, 150 mg, Intravenous, Once **FOLLOWED BY** amiodarone (NEXTERONE PREMIX) 360-4.14 MG/200ML-% (1.8 mg/mL) IV infusion, 60 mg/hr, Intravenous, Continuous **FOLLOWED BY** amiodarone (NEXTERONE PREMIX) 360-4.14 MG/200ML-% (1.8 mg/mL) IV infusion, 30 mg/hr, Intravenous, Continuous, Sreenath, Sudheer B, MD .  benzonatate (TESSALON) capsule 100 mg, 100 mg, Oral, TID, Lucky Cowboy, Erskine Squibb, MD, 100 mg at 02/06/20 1041 .  Chlorhexidine Gluconate Cloth 2 % PADS 6 each, 6 each,  Topical, Q0600, Sidney Ace, MD, 6 each at 12-Feb-2020 1042 .  guaiFENesin-dextromethorphan (ROBITUSSIN DM) 100-10 MG/5ML syrup 5 mL, 5 mL, Oral, Q4H PRN, Algernon Huxley, MD, 5 mL at 02/06/20 1509 .  haloperidol lactate (HALDOL) injection 2 mg, 2 mg, Intravenous, Q4H PRN, Sharion Settler, NP, 2 mg at 02/05/20 1021 .  lactated ringers infusion, , Intravenous, Continuous, Sharion Settler, NP, Last Rate: 75 mL/hr at 02-12-20 0958, New Bag at 2020/02/12 0958 .  levothyroxine (SYNTHROID) tablet 75 mcg, 75 mcg, Oral, QAC breakfast, Algernon Huxley, MD, 75 mcg at 02/06/20 0550 .  ondansetron (ZOFRAN) injection 4 mg, 4 mg, Intravenous, Q6H PRN, Lucky Cowboy, Erskine Squibb, MD .  pantoprazole (PROTONIX) EC tablet 40 mg, 40 mg, Oral, Daily, Sreenath, Sudheer B, MD .  piperacillin-tazobactam (ZOSYN) IVPB 3.375 g, 3.375 g, Intravenous, Q12H, Lu Duffel, [facility], Last Rate: 12.5 mL/hr at Feb 12, 2020 0823, 3.375 g at 02/12/2020 0823 .  traMADol (ULTRAM) tablet 50 mg, 50 mg, Oral, Q6H PRN, Sharion Settler, NP, 50 mg at 02/05/20 0244    ALLERGIES   Allopurinol and Lasix [furosemide]    REVIEW OF SYSTEMS     Unable to obtain review of systems due to encephalopathy  PHYSICAL EXAMINATION   Vital Signs: Temp:  [98.4 F (36.9 C)-101.3 F (38.5 C)] 98.9 F (37.2 C) (02/28 0736) Pulse Rate:  [66-115] 115 (02/28 0736) Resp:  [16-22] 20 (02/28 0736) BP: (81-152)/(38-89) 105/54 (02/28 0736) SpO2:  [90 %-96 %] 90 % (02/28 0736) Weight:  [73.8 kg-74.8 kg] 74.8 kg (02/28 [id number])  GENERAL: Chronically ill-appearing HEAD: Normocephalic, atraumatic.  EYES: Pupils equal, round, reactive to light.  No scleral icterus.  MOUTH: Moist mucosal membrane. NECK: Supple. No thyromegaly. No nodules. No JVD.  PULMONARY: Decreased breath sounds bilaterally CARDIOVASCULAR: S1 and S2. Regular rate and rhythm. No murmurs, rubs, or gallops.  GASTROINTESTINAL: Soft, nontender, non-distended. No masses. Positive bowel sounds. No hepatosplenomegaly.  MUSCULOSKELETAL: No swelling, clubbing, or edema.  NEUROLOGIC: Mild distress due to acute illness SKIN:intact,warm,dry   PERTINENT DATA     Infusions: . amiodarone     Followed by  . amiodarone    . lactated ringers 75 mL/hr at 02-12-20 0958  .  piperacillin-tazobactam (ZOSYN)  IV 3.375 g (February 12, 2020 [id number])   Scheduled Medications: . amiodarone  150 mg Intravenous Once  . benzonatate  100 mg Oral TID  . Chlorhexidine Gluconate Cloth  6 each Topical Q0600  . levothyroxine  75 mcg Oral QAC breakfast  . pantoprazole  40 mg Oral Daily   PRN Medications: acetaminophen **OR** acetaminophen, albuterol, guaiFENesin-dextromethorphan, haloperidol lactate, ondansetron (ZOFRAN) IV, traMADol Hemodynamic parameters:   Intake/Output: 02/27 0701 - 02/28 0700 In: -  Out: 825 [Urine:825]  Ventilator  Settings:      LAB RESULTS:  Basic Metabolic Panel: Recent Labs  Lab 02/03/20 [id number] 02/03/20 [id number] 01/31/2020 [id number] 01/27/2020 [id number] 02/05/20 0321 02/05/20 0321 02/06/20 0451 02-12-2020 0526  NA 141  --  142  --  141  --  142 140  K 3.9   < > 4.0   < > 3.7   < > 3.5 3.5  CL 110  --  109  --  111  --  109 107  CO2 24  --  24  --  23  --  24 19*  GLUCOSE 98  --  105*  --  99  --  85 115*  BUN 36*  --  29*  --  25*  --  20 35*  CREATININE 1.64*  --  1.47*  --  1.39*  --  1.38* 2.61*  CALCIUM 9.1  --  8.8*  --  8.4*  --  9.0 9.2  MG 2.0  --   --   --   --   --   --   --    < > = values in this interval not displayed.   Liver Function Tests: Recent Labs  Lab 02/03/20 0658 02/01/2020 0828 02/05/20 0321 02/06/20 0451 02/10/2020 0526  AST 24 29 25  34 305*  ALT 19 21 18 19  195*  ALKPHOS 57 56 49 46 187*  BILITOT 1.2 0.9 1.0 1.3* 4.9*  PROT 7.1 7.0 6.2* 6.4* 6.2*  ALBUMIN 3.6 3.7 3.3* 3.5 3.2*   Recent Labs  Lab 02/03/20 0658  LIPASE 40   Recent Labs  Lab 02/03/20 0658  AMMONIA 25   CBC: Recent Labs  Lab 02/03/20 0658 01/14/2020 0828 02/05/20 0321 02/06/20 0451 02/10/2020 0526  WBC 5.6 4.7 6.2 6.0 14.5*  NEUTROABS 3.7 3.5 4.5 4.5  --   HGB 11.9* 12.2* 10.0* 10.6* 11.8*  HCT 36.8* 38.2* 31.3* 33.0* 35.8*  MCV 91.1 91.6 91.8 89.7 88.8  PLT 108* 90* 99* 129* 132*   Cardiac Enzymes: No results for input(s): CKTOTAL, CKMB,  CKMBINDEX, TROPONINI in the last 168 hours. BNP: Invalid input(s): POCBNP CBG: No results for input(s): GLUCAP in the last 168 hours.   IMAGING RESULTS:  Imaging: DG Chest Port 1 View  Result Date: 2020-02-10 CLINICAL DATA:  Hypoxia EXAM: PORTABLE CHEST 1 VIEW COMPARISON:  02/06/2020 FINDINGS: Heart size and vascularity normal. Atherosclerotic aorta. Elevated left hemidiaphragm unchanged. Chronic right rib fractures unchanged. Lungs are clear without infiltrate or effusion or edema. IMPRESSION: No active disease. Electronically Signed   By: Franchot Gallo M.D.   On: 02/10/2020 11:11   DG Chest Port 1 View  Result Date: 02/06/2020 CLINICAL DATA:  Productive cough, congestion, wheezing EXAM: PORTABLE CHEST 1 VIEW COMPARISON:  05/23/2011 FINDINGS: Single frontal view of the chest demonstrates mild enlargement the cardiac silhouette, likely accentuated by portable AP technique. Thoracic aorta remains ectatic. The patient is slightly rotated to the right, accentuating the vascular markings at the right hilum and right lung base. No definite airspace disease. No effusion or pneumothorax. IMPRESSION: 1. Enlarged cardiac silhouette, with stable thoracic aortic ectasia. 2. No acute airspace disease. Electronically Signed   By: Randa Ngo M.D.   On: 02/06/2020 19:25       ASSESSMENT AND PLAN      Septic shock -Present on admission to medical intensive care unit -On IV Zosyn -1 L LR bolus stat -GI on case plan for ERCP -use vasopressors to keep MAP>65 -follow ABG and LA -follow up cultures -consider Solu-Cortef -Possible transfer to [facility] for ERCP   Atrial fibrillation with rapid ventricular response  -Patient with hypotension due to septic shock -We will start with amiodarone for rate control patient is on heparin GTT -Correct electrolyte derangements -Supplemental O2 for hypoxemia   Right lower extremity DVT -Status post thrombectomy -Changing Eliquis to heparin due to  procedure planning   Renal Failure-most likely due septic nephropathy -follow chem 7 -follow UO -continue Foley Catheter-assess need daily -Renal dosing for all meds, DC nonessential nephrotoxic medications   ID -continue IV abx as prescibed -follow up cultures  GI/Nutrition GI PROPHYLAXIS as indicated DIET-->TF's as tolerated Constipation protocol as indicated  ENDO - ICU hypoglycemic\Hyperglycemia protocol -check FSBS per protocol   ELECTROLYTES -follow labs as  needed -replace as needed -pharmacy consultation   DVT/GI PRX ordered -SCDs  TRANSFUSIONS AS NEEDED MONITOR FSBS ASSESS the need for LABS as needed   Critical care provider statement:    Critical care time (minutes):  33   Critical care time was exclusive of:  Separately billable procedures and treating other patients   Critical care was necessary to treat or prevent imminent or life-threatening deterioration of the following conditions:   Acute hypoxemic respiratory failure, A. fib RVR, septic shock, intra-abdominal infection with ascending cholangitis, AKI, multiple comorbid conditions   Critical care was time spent personally by me on the following activities:  Development of treatment plan with patient or surrogate, discussions with consultants, evaluation of patient's response to treatment, examination of patient, obtaining history from patient or surrogate, ordering and performing treatments and interventions, ordering and review of laboratory studies and re-evaluation of patient's condition.  I assumed direction of critical care for this patient from another provider in my specialty: no    This document was prepared using Dragon voice recognition software and may include unintentional dictation errors.    Ottie Glazier, M.D.  Division of [place]

## 2020-02-08 NOTE — Consult Note (Signed)
[place] for heparin drip Indication: afib (recent h/o DVT on Eliquis)  Allergies  Allergen Reactions  . Allopurinol Swelling  . Lasix [Furosemide]     Patient Measurements: Height: 6\' 2"  (188 cm) Weight: 164 lb 14.5 oz (74.8 kg) IBW/kg (Calculated) : 82.2 Heparin Dosing Weight: 76.6kg  Vital Signs: Temp: 98.9 F (37.2 C) (02/28 0736) Temp Source: Axillary (02/28 0736) BP: 77/38 (02/28 1222) Pulse Rate: 111 (02/28 1222)  Labs: Recent Labs    02/05/20 0321 02/05/20 0321 02/06/20 0451 03-05-2020 0526  HGB 10.0*   < > 10.6* 11.8*  HCT 31.3*  --  33.0* 35.8*  PLT 99*  --  129* 132*  HEPARINUNFRC 0.56  --   --   --   CREATININE 1.39*  --  1.38* 2.61*   < > = values in this interval not displayed.    Estimated Creatinine Clearance: 22.7 mL/min (A) (by C-G formula based on SCr of 2.61 mg/dL (H)).   Medical History: Past Medical History:  Diagnosis Date  . Asthma   . BPH (benign prostatic hyperplasia)   . COPD (chronic obstructive pulmonary disease) ([place])   . Diverticulosis   . GERD (gastroesophageal reflux disease)   . Gout   . Gout   . History of kidney stones   . Hypertension   . Hypothyroidism   . Klinefelter syndrome   . Nephrolithiasis   . [facility] spotted fever    history of....  . Shingles   . Sleep apnea    NO CPAP  . Stroke [facility])    TIA     Assessment: Pharmacy has been consulted to initiate and monitor heparin drip in this 86 yo male with DVT s/p thrombectomy. Patient was started on Eliquis for treatment 2/26 and has received 4 doses of 10 mg with last dose 2/28 at 0000. Patient with acute ascending cholangitis and will be transferring to [facility] for ERCP. Eliquis held and plan to start heparin drip.  Heparin consult for afib. Patient on Eliquis for DVT treatment, however HL goal still the same.   Goal of Therapy:  APTT 66-102 s Heparin level 0.3-0.7 units/ml Monitor platelets by  anticoagulation protocol: Yes   Plan:  Baseline labs ordered including HL since patient has been on Eliquis for ~ 48 hours. Will plan to dose based on APTT unless baseline HL < 0.10. Patient previously on heparin prior to switch to Eliquis and was therapeutic on 700 units/hr.  Will order heparin 3700 unit bolus followed by heparin drip at 750 units/hr. APTT ordered for 2200 for now. Plan is to transfer to [facility], but will order labs in the event patient stays.  Tawnya Crook, PharmD Clinical Pharmacist 2020-03-05 12:25 PM

## 2020-02-08 NOTE — Consult Note (Signed)
Pharmacy Antibiotic Note  Richard Fry is a 86 y.o. male admitted on 02/01/2020 with cholangitis.  Pharmacy has been consulted for Zosyn dosing.  Pt with new fever, MEWS 2, and soft BP.  LFT's had been trending down, but spiked AST 39---->34>305 ALT  27---->19>195  Scr spiked as well Scr 1.92----1.38>2.61  WBC 7.8---->6.0>14.5  Will conservatively dose q12 given sudden reverse trend in Scr and re-assess with tomorrow am labs  Plan: Zosyn 3.375g q12 hr (extended infusion)  Height: 6\' 2"  (188 cm) Weight: 164 lb 14.5 oz (74.8 kg) IBW/kg (Calculated) : 82.2  Temp (24hrs), Avg:99.1 F (37.3 C), Min:98.4 F (36.9 C), Max:101.3 F (38.5 C)  Recent Labs  Lab 02/03/20 0658 01/30/2020 0828 02/05/20 0321 02/06/20 0451 2020/02/28 0526 28-Feb-2020 0721  WBC 5.6 4.7 6.2 6.0 14.5*  --   CREATININE 1.64* 1.47* 1.39* 1.38* 2.61*  --   LATICACIDVEN  --   --   --   --   --  4.4*    Estimated Creatinine Clearance: 22.7 mL/min (A) (by C-G formula based on SCr of 2.61 mg/dL (H)).    Allergies  Allergen Reactions  . Allopurinol Swelling  . Lasix [Furosemide]     Antimicrobials this admission: 2/25 Cefazolin x 1 2/28 Zosyn >>  Dose adjustments this admission: None  Microbiology results: 2/23 BCx: NG x 5 days 2/23 UCx: Insignificant growth   2/28 BCx pending  Thank you for allowing pharmacy to be a part of this patient's care.  Lu Duffel, PharmD, BCPS Clinical Pharmacist 02-28-20 7:56 AM

## 2020-02-08 NOTE — Progress Notes (Signed)
This nurse, MD and carelink at patient bedside. Decision made to not transfer patient at this time, unsafe transfer.

## 2020-02-08 NOTE — Progress Notes (Addendum)
   02/21/2020 1300  Clinical Encounter Type  Visited With Patient and family together  Visit Type Initial;Death  Referral From Nurse  Consult/Referral To Chaplain  Chaplain received page at 13:17, patient was actively dying. Chaplain talked to patient, ensuring him that he was not alone and Chaplain offered silent prayer. A close family member, Olegario Shearer arrived shortly before patient passed. Chaplain offered further assistance to [place], but she said she was ok and would get herself together in her car. Patient was pronounced deceased at 13:38.

## 2020-02-08 NOTE — Discharge Summary (Deleted)
Physician Discharge Summary  Richard Fry [id number] DOB: 06/25/1934 DOA: 01/11/2020  PCP: Tracie Harrier, MD  Admit date: 01/13/2020 Discharge date: 02/22/2020  Admitted From: Home Disposition: Transfer to [facility] hospital  Discharge Condition: Guarded CODE STATUS: DNR Diet recommendation:NPO  Lukes Kingis a 86 y.o.malewith medical history significant ofhtn,hypothyroidism,RMSF seen in ed for chest pain, which pt then points to ruq pain. HPIis per edmd note pt is altered with sitter at bedside, he is delirius and says he lives in [place].Per caretaker, at PCP appt few weeks ago it showed liver enzymes were high in the 300's and had usg was done and repeat usg today shows dilated CBD.  2/24: Patient seen and examined.  Sitter at bedside.  Patient appears alert and is answering all questions appropriately.  MRCP findings noted.  Reached out to gastroenterology.  Currently no emergent indication for ERCP as LFTs are trending down patient is nontoxic and noncholangitic in appearance.  Markedly elevated D-dimer noted.  Right lower extremity Doppler ordered.  Patient has extensive clot burden in nearly all the vasculature within the right lower extremity.  2/25: Patient seen and examined.  Remains somewhat confused.  Liver function stable.  No signs of cholangitis.  Extensive DVT in right lower extremity.  Vascular consulted yesterday.  Plan for surgical thrombectomy/venous lysis today.  N.p.o. for procedure.  2/26: Patient seen and examined.  Apparently was agitated yesterday evening required administration of Haldol.  Somewhat altered this morning, trying to get out of bed.  Easily redirected.  Postop day #1 status post surgical thrombectomy.  2/27: Patient seen and examined.  No acute interval changes overnight.  Postoperative day 2 status post surgical thrombectomy.  Surgical right removed.  Started on Eliquis.  2/28: Patient became febrile last night, T-max 101.3.  This  morning LFTs noted to be acutely elevated.  Bilirubin acutely elevated.  Kidney function also acutely worsened.  Leukocytosis worsened up to 14.  Patient mental status has deteriorated.  Symptoms and clinical presentation consistent with acute ascending cholangitis.  Cultures drawn overnight.  Patient started on intravenous Zosyn this morning.  Gastroenterology [facility], Dr. Alice Reichert, made aware.   After evaluation of patient have spoken with advanced endoscopist Dr Watt Climes.  Recommending urgent transfer to [facility] long hospital for advanced endoscopy and ERCP.  Given the patient's current status and his overall clinical deterioration I have moved him to stepdown unit at [facility].  I have notified ICU Dr. Lanney Gins.  At time of this note patient is currently in stepdown unit.  Decreased level of mentation.  Tachypneic.  Saturations mid 90s on 2 L nasal cannula.  Borderline hypotensive.  Has received 2.5 L bolus isotonic fluids in addition to 75 cc/h lactated Ringer's maintenance fluids.  Telemetry demonstrates tachycardia, likely underlying atrial fibrillation.  Patient is on Eliquis.  Will hold Eliquis and start heparin GTT.  Last Eliquis dose 2/28 at 0000.  Will start IV amiodarone infusion.  150mg  bolus over 30 minutes, followed by infusion.  Target HR<110.  Will make efforts to stabilize patient is much as possible prior to transfer.  Stepdown bed requested at [facility]  Discharge Diagnoses:  Principal Problem:   Chest pain Active Problems:   COPD (chronic obstructive pulmonary disease) (HCC)   Hypothyroidism   Hypertension  New onset atrial fibrillation Noted on telemetry after clinical deterioration Rates 120s-180s when agitated On Eliquis for LE DVT Plan: As patient is planning to transfer to [facility], will attempt rate control Stop Eliquis Start heparin infusion Amiodarone IV: 150mg   IV bolus over 30 minutes, followed by IV infusion  Acute ascending cholangitis Patient became febrile early morning on  March 01, 2020 Repeat LFTs demonstrated acute change in liver function test, bilirubin, kidney function Elevated white count of 14 Symptoms consistent with acute ascending cholangitis Gastroenterology at [facility] made aware No availability of ERCP at [place]: Patient to transfer to [facility] long hospital Stepdown bed requested Endoscopist at [facility] made aware (Dr. Watt Climes) Patient started on IV Zosyn, recommend continuing upon transfer IV fluid bolus per sepsis protocol Maintenance fluids with lactated Ringer Daily CMP Trend lactic acid Transfer in guarded condition to [facility] hospital  Acute choledocholithiasis Patient has stone in common bile duct with 8 mm CBD on MRCP Acute worsening of clinical status consistent with acute ascending cholangitis See above for plan  Acute lower extremity DVT Patient had marked elevated D-dimer Right lower extremity duplex confirms extensive acute DVT Heparin GTT initiated Vascular surgery consult called for evaluation for surgical thrombectomy surgical thrombectomy 01/23/2020 Eliquis started per vascular surgeon recommendations Plan: Continue Eliquis, 10 mg twice daily x7 days, followed by 5 mg twice daily indefinitely or until directed otherwise by a outpatient physician Postoperative care PT/OT: Recommending home with home health, ordered No bleeding noted over interval Plan was to discharge patient home with home health however given acute change in status he will transfer to [facility] long hospital for further evaluation and management    Hypothyroidism Continue home Synthroid  Hypertension Currently on no medications Pressure controlled Vitals per unit protocol  COPD Stable, no evidence of exacerbation Will continue home regimen of albuterol and Singulair  Discharge Instructions  Discharge Instructions    Diet - low sodium heart healthy   Complete by: As directed    Increase activity slowly   Complete by: As directed       Allergies as of Mar 01, 2020      Reactions   Allopurinol Swelling   Lasix [furosemide]       Medication List    TAKE these medications   acetaminophen 325 MG tablet Commonly known as: TYLENOL Take 2 tablets (650 mg total) by mouth every 6 (six) hours as needed for mild pain (or Fever >/= 101).   acetaminophen 650 MG suppository Commonly known as: TYLENOL Place 1 suppository (650 mg total) rectally every 6 (six) hours as needed for mild pain (or Fever >/= 101).   albuterol 108 (90 Base) MCG/ACT inhaler Commonly known as: VENTOLIN HFA Inhale 2 puffs into the lungs every 6 (six) hours as needed for wheezing or shortness of breath.   apixaban 5 MG Tabs tablet Commonly known as: ELIQUIS Take 2 tablets (10 mg total) by mouth 2 (two) times daily.   apixaban 5 MG Tabs tablet Commonly known as: ELIQUIS Take 1 tablet (5 mg total) by mouth 2 (two) times daily. Start taking on: February 12, 2020   aspirin EC 81 MG tablet Take 81 mg by mouth daily with breakfast.   levothyroxine 75 MCG tablet Commonly known as: SYNTHROID Take 75 mcg by mouth daily before breakfast.   montelukast 4 MG chewable tablet Commonly known as: SINGULAIR Chew 4 mg by mouth daily.   multivitamin with minerals Tabs tablet Take 1 tablet by mouth daily.   Omega-3 1000 MG Caps Take 1,000 mg by mouth daily.   omeprazole 20 MG capsule Commonly known as: PRILOSEC Take 20 mg by mouth daily before breakfast.   Oyster Shell Calcium 500-400 MG-UNIT Tabs Take 1 tablet by mouth 2 (two) times daily.  piperacillin-tazobactam 3.375 GM/50ML IVPB Commonly known as: ZOSYN Inject 50 mLs (3.375 g total) into the vein every 12 (twelve) hours.      Follow-up Information    Dew, Erskine Squibb, MD Follow up in 2 week(s).   Specialties: Vascular Surgery, Radiology, Interventional Cardiology Why: Can see Dew or Arna Medici. Will need right lower extremity DVT study with visit.  Contact information: [place]  [phone]          Allergies  Allergen Reactions  . Allopurinol Swelling  . Lasix [Furosemide]     Consultations:  [place]  Vascular surgery- Dew   Procedures/Studies: DG Skull 1-3 Views  Result Date: 01/27/2020 CLINICAL DATA:  Screening for foreign body for MRI EXAM: SKULL - 1-3 VIEW COMPARISON:  None. FINDINGS: No intracranial foreign body. No orbital foreign body. No acute skeletal abnormality. IMPRESSION: Negative for intracranial or orbital foreign body. Electronically Signed   By: Franchot Gallo M.D.   On: 01/30/2020 15:01   DG Pelvis 1-2 Views  Result Date: 01/30/2020 CLINICAL DATA:  MRI clearance EXAM: PELVIS - 1-2 VIEW COMPARISON:  None. FINDINGS: Left hip replacement in satisfactory position. No other foreign body. Mild degenerative change right hip.  No acute skeletal abnormality. IMPRESSION: Left hip replacement.  No contraindication to MRI in the pelvis. Electronically Signed   By: Franchot Gallo M.D.   On: 01/17/2020 15:02   CT HEAD WO CONTRAST  Result Date: 01/26/2020 CLINICAL DATA:  Encephalopathy EXAM: CT HEAD WITHOUT CONTRAST TECHNIQUE: Contiguous axial images were obtained from the base of the skull through the vertex without intravenous contrast. COMPARISON:  08/10/2015 FINDINGS: Brain: No evidence of acute infarction, hemorrhage, hydrocephalus, extra-axial collection or mass lesion/mass effect. Extensive low-density changes within the periventricular and subcortical white matter compatible with chronic microvascular ischemic change. Moderate diffuse cerebral volume loss. Vascular: No hyperdense vessel or unexpected calcification. Skull: Normal. Negative for fracture or focal lesion. Sinuses/Orbits: No acute finding. Other: None. IMPRESSION: 1.  No acute intracranial findings. 2.  Chronic microvascular ischemic change and cerebral volume loss. Electronically Signed   By: Davina Poke D.O.   On: 01/27/2020 16:28   MR ABDOMEN MRCP WO  CONTRAST  Result Date: 01/23/2020 CLINICAL DATA:  Cholelithiasis chest pain EXAM: MRI ABDOMEN WITHOUT CONTRAST  (INCLUDING MRCP) TECHNIQUE: Multiplanar multisequence MR imaging of the abdomen was performed. Heavily T2-weighted images of the biliary and pancreatic ducts were obtained, and three-dimensional MRCP images were rendered by post processing. COMPARISON:  Ultrasound 01/15/2020 FINDINGS: Lower chest: Multiple sequences are degraded by respiratory motion. No acute findings. Hepatobiliary: No focal hepatic abnormality. Sludge and multiple small stones within the gallbladder. No significant intra hepatic biliary dilatation. Slightly enlarged extrahepatic common bile duct measuring up to 8 mm. Multiple hypointense filling defects within the mid and distal common bile duct measuring up to 7 mm in size, for example series 15, image number 16, and series 2 image number 15 and 16. Pancreas: No mass, inflammatory changes, or other parenchymal abnormality identified. Spleen:  Accessory splenule.  Spleen otherwise unremarkable Adrenals/Urinary Tract: Adrenal glands are within normal limits. Multiple cysts within the bilateral kidneys. On the right, cysts measure up to 3.7 cm. On the left, cysts measure up to 8.7 cm. No hydronephrosis. Stomach/Bowel: Visualized portions within the abdomen are unremarkable. Vascular/Lymphatic: No pathologically enlarged lymph nodes identified. No abdominal aortic aneurysm demonstrated. Other:  No ascites in the upper abdomen Musculoskeletal: No suspicious bone lesions identified. IMPRESSION: 1. Sludge and multiple small stones within the  gallbladder. No definitive right upper quadrant inflammatory changes. 2. Choledocholithiasis with slightly enlarged extrahepatic common bile duct up to 8 mm. 3. Multiple renal cysts Electronically Signed   By: Donavan Foil M.D.   On: 01/13/2020 23:12   PERIPHERAL VASCULAR CATHETERIZATION  Result Date: 01/12/2020 See op note  MR 3D Recon At  Scanner  Result Date: 01/11/2020 CLINICAL DATA:  Cholelithiasis chest pain EXAM: MRI ABDOMEN WITHOUT CONTRAST  (INCLUDING MRCP) TECHNIQUE: Multiplanar multisequence MR imaging of the abdomen was performed. Heavily T2-weighted images of the biliary and pancreatic ducts were obtained, and three-dimensional MRCP images were rendered by post processing. COMPARISON:  Ultrasound 01/15/2020 FINDINGS: Lower chest: Multiple sequences are degraded by respiratory motion. No acute findings. Hepatobiliary: No focal hepatic abnormality. Sludge and multiple small stones within the gallbladder. No significant intra hepatic biliary dilatation. Slightly enlarged extrahepatic common bile duct measuring up to 8 mm. Multiple hypointense filling defects within the mid and distal common bile duct measuring up to 7 mm in size, for example series 15, image number 16, and series 2 image number 15 and 16. Pancreas: No mass, inflammatory changes, or other parenchymal abnormality identified. Spleen:  Accessory splenule.  Spleen otherwise unremarkable Adrenals/Urinary Tract: Adrenal glands are within normal limits. Multiple cysts within the bilateral kidneys. On the right, cysts measure up to 3.7 cm. On the left, cysts measure up to 8.7 cm. No hydronephrosis. Stomach/Bowel: Visualized portions within the abdomen are unremarkable. Vascular/Lymphatic: No pathologically enlarged lymph nodes identified. No abdominal aortic aneurysm demonstrated. Other:  No ascites in the upper abdomen Musculoskeletal: No suspicious bone lesions identified. IMPRESSION: 1. Sludge and multiple small stones within the gallbladder. No definitive right upper quadrant inflammatory changes. 2. Choledocholithiasis with slightly enlarged extrahepatic common bile duct up to 8 mm. 3. Multiple renal cysts Electronically Signed   By: Donavan Foil M.D.   On: 01/28/2020 23:12   US Venous Img Lower Unilateral Right (DVT)  Result Date: 02/03/2020 CLINICAL DATA:  Right lower  extremity pain for the past several days. History of smoking. Evaluate for DVT. EXAM: RIGHT LOWER EXTREMITY VENOUS DOPPLER ULTRASOUND TECHNIQUE: Gray-scale sonography with graded compression, as well as color Doppler and duplex ultrasound were performed to evaluate the lower extremity deep venous systems from the level of the common femoral vein and including the common femoral, femoral, profunda femoral, popliteal and calf veins including the posterior tibial, peroneal and gastrocnemius veins when visible. The superficial great saphenous vein was also interrogated. Spectral Doppler was utilized to evaluate flow at rest and with distal augmentation maneuvers in the common femoral, femoral and popliteal veins. COMPARISON:  None. FINDINGS: Contralateral Common Femoral Vein: Respiratory phasicity is normal and symmetric with the symptomatic side. No evidence of thrombus. Normal compressibility. The examination is positive for extensive occlusive DVT extending from the right common femoral vein (image 5) into the imaged portions of the right deep femoral vein (image 13). Nonocclusive thrombus is seen within the saphenofemoral junction (image 11). There is hypoechoic occlusive thrombus involving the proximal (image 18), mid (image 21) and distal (image 26) aspects of the right femoral vein, extending to involve the popliteal vein (image 31). Nonocclusive thrombus seen within the saphenofemoral junction (image 11). Hypoechoic occlusive thrombus involving both paired posterior tibial (image 39) and peroneal veins (image 42). IMPRESSION: The examination is positive for extensive occlusive DVT involving near the entirety of the right lower extremity venous system Electronically Signed   By: Sandi Mariscal M.D.   On: 02/03/2020 11:55   DG Chest  Port 1 View  Result Date: 02/25/2020 CLINICAL DATA:  Hypoxia EXAM: PORTABLE CHEST 1 VIEW COMPARISON:  02/06/2020 FINDINGS: Heart size and vascularity normal. Atherosclerotic aorta.  Elevated left hemidiaphragm unchanged. Chronic right rib fractures unchanged. Lungs are clear without infiltrate or effusion or edema. IMPRESSION: No active disease. Electronically Signed   By: Franchot Gallo M.D.   On: 02/25/2020 11:11   DG Chest Port 1 View  Result Date: 02/06/2020 CLINICAL DATA:  Productive cough, congestion, wheezing EXAM: PORTABLE CHEST 1 VIEW COMPARISON:  05/23/2011 FINDINGS: Single frontal view of the chest demonstrates mild enlargement the cardiac silhouette, likely accentuated by portable AP technique. Thoracic aorta remains ectatic. The patient is slightly rotated to the right, accentuating the vascular markings at the right hilum and right lung base. No definite airspace disease. No effusion or pneumothorax. IMPRESSION: 1. Enlarged cardiac silhouette, with stable thoracic aortic ectasia. 2. No acute airspace disease. Electronically Signed   By: Randa Ngo M.D.   On: 02/06/2020 19:25   DG Abdomen Acute W/Chest  Result Date: 02/06/2020 CLINICAL DATA:  Chest and abdominal pain EXAM: DG ABDOMEN ACUTE W/ 1V CHEST COMPARISON:  05/23/2011 chest radiograph. FINDINGS: Stable cardiomediastinal silhouette with normal heart size. No pneumothorax. No pleural effusion. Lungs appear clear, with no acute consolidative airspace disease and no pulmonary edema. Healed deformities in multiple posterior right mid ribs. No disproportionately dilated small bowel loops or air-fluid levels. No evidence of pneumatosis or pneumoperitoneum. No radiopaque nephrolithiasis. Partially visualized left total hip arthroplasty. Marked lumbar spondylosis. IMPRESSION: 1. No active disease in the chest. 2. Nonobstructive bowel gas pattern. Electronically Signed   By: Ilona Sorrel M.D.   On: 01/12/2020 11:52   ECHOCARDIOGRAM COMPLETE  Result Date: 02/05/2020    ECHOCARDIOGRAM REPORT   Patient Name:   BREIGHTON RATER Date of Exam: 02/03/2020 Medical Rec #:  [id number]  Height:       74.0 in Accession #:    [id number]  Weight:       168.8 lb Date of Birth:  1934-07-21  BSA:          2.022 m Patient Age:    35 years   BP:           133/64 mmHg Patient Gender: M          HR:           65 bpm. Exam Location:  [facility] Procedure: 2D Echo, Cardiac Doppler and Color Doppler Indications:     I26.99 Pulmonary Embolus  History:         Patient has no prior history of Echocardiogram examinations.                  Risk Factors:Hypertension. Sleep apnea. Asthma. Stroke.                  Hypothyroidism. COPD.  Sonographer:     Wilford Sports Rodgers-Jones Referring Phys:  [id number] Sidney Ace Diagnosing Phys: Kate Sable MD  Sonographer Comments: Technically difficult study due to poor echo windows. IMPRESSIONS  1. Left ventricular ejection fraction, by estimation, is 55 to 60%. The left ventricle has normal function. The left ventricle has no regional wall motion abnormalities. Left ventricular diastolic parameters were normal.  2. Right ventricular systolic function is normal. The right ventricular size is normal. There is normal pulmonary artery systolic pressure.  3. The mitral valve is grossly normal. No evidence of mitral valve regurgitation.  4. The aortic valve was not well visualized. Aortic valve regurgitation  is not visualized.  5. Pulmonic valve regurgitation not assessed.  6. The inferior vena cava is normal in size with greater than 50% respiratory variability, suggesting right atrial pressure of 3 mmHg. FINDINGS  Left Ventricle: Left ventricular ejection fraction, by estimation, is 55 to 60%. The left ventricle has normal function. The left ventricle has no regional wall motion abnormalities. The left ventricular internal cavity size was normal in size. There is  no left ventricular hypertrophy. Left ventricular diastolic parameters were normal. Right Ventricle: The right ventricular size is normal. Right vetricular wall thickness was not assessed. Right ventricular systolic function is normal. There is normal pulmonary artery  systolic pressure. The tricuspid regurgitant velocity is 2.35 m/s, and with an assumed right atrial pressure of 3 mmHg, the estimated right ventricular systolic pressure is [id number] mmHg. Left Atrium: Left atrial size was normal in size. Right Atrium: Right atrial size was normal in size. Pericardium: There is no evidence of pericardial effusion. Mitral Valve: The mitral valve is grossly normal. No evidence of mitral valve regurgitation. Tricuspid Valve: The tricuspid valve is not well visualized. Tricuspid valve regurgitation is not demonstrated. Aortic Valve: The aortic valve was not well visualized. Aortic valve regurgitation is not visualized. Pulmonic Valve: The pulmonic valve was not well visualized. Pulmonic valve regurgitation not assessed. Aorta: The aortic root is normal in size and structure. Venous: The inferior vena cava is normal in size with greater than 50% respiratory variability, suggesting right atrial pressure of 3 mmHg. IAS/Shunts: No atrial level shunt detected by color flow Doppler.  LEFT VENTRICLE PLAX 2D LVIDd:         3.55 cm  Diastology LVIDs:         2.76 cm  LV e' lateral:   5.77 cm/s LV PW:         1.13 cm  LV E/e' lateral: 11.6 LV IVS:        1.17 cm  LV e' medial:    7.07 cm/s LVOT diam:     2.00 cm  LV E/e' medial:  9.4 LV SV:         70 LV SV Index:   35 LVOT Area:     3.14 cm  RIGHT VENTRICLE RV Basal diam:  3.37 cm RV S prime:     10.90 cm/s TAPSE (M-mode): 1.4 cm LEFT ATRIUM             Index       RIGHT ATRIUM          Index LA diam:        3.90 cm 1.93 cm/m  RA Area:     9.99 cm LA Vol (A2C):   31.5 ml 15.58 ml/m RA Volume:   23.50 ml 11.62 ml/m LA Vol (A4C):   22.5 ml 11.13 ml/m LA Biplane Vol: 29.0 ml 14.34 ml/m  AORTIC VALVE LVOT Vmax:   101.00 cm/s LVOT Vmean:  74.600 cm/s LVOT VTI:    0.224 m  AORTA Ao Root diam: 3.40 cm MITRAL VALVE               TRICUSPID VALVE MV Area (PHT): 2.83 cm    TR Peak grad:   22.1 mmHg MV Decel Time: 268 msec    TR Vmax:        235.00 cm/s  MV E velocity: 66.70 cm/s MV A velocity: 77.70 cm/s  SHUNTS MV E/A ratio:  0.86        Systemic VTI:  0.22 m  Systemic Diam: 2.00 cm Kate Sable MD Electronically signed by Kate Sable MD Signature Date/Time: 02/05/2020/12:38:06 PM    Final    US ABDOMEN LIMITED RUQ  Result Date: 01/30/2020 CLINICAL DATA:  Abdominal pain EXAM: ULTRASOUND ABDOMEN LIMITED RIGHT UPPER QUADRANT COMPARISON:  01/28/2020 FINDINGS: Gallbladder: Sludge and multiple small calculi measuring up to 4 mm are again seen. No wall thickening or pericholecystic fluid visualized. No sonographic Murphy sign noted by sonographer. Common bile duct: Diameter: Measures up to 8 mm, increased since the prior study. Liver: No focal lesion identified. Within normal limits in parenchymal echogenicity. Portal vein is patent on color Doppler imaging with normal direction of blood flow towards the liver. Other: None. IMPRESSION: Gallbladder sludge and small calculi as before. No sonographic evidence of acute cholecystitis. Increase in size of common bile duct. Distal obstruction is not excluded. Electronically Signed   By: Macy Mis M.D.   On: 01/19/2020 12:14   US Abdomen Limited RUQ  Result Date: 01/28/2020 CLINICAL DATA:  Elevated liver function tests. EXAM: ULTRASOUND ABDOMEN LIMITED RIGHT UPPER QUADRANT COMPARISON:  06/16/2013 FINDINGS: Gallbladder: Gallbladder sludge is noted as well as multiple tiny less than 5 mm gallstones. No evidence of gallbladder wall thickening or pericholecystic fluid. No sonographic Murphy sign noted by sonographer. Common bile duct: Diameter: 4 mm, within normal limits. Liver: No focal lesion identified. Within normal limits in parenchymal echogenicity. Portal vein is patent on color Doppler imaging with normal direction of blood flow towards the liver. Other: None. IMPRESSION: Cholelithiasis. No sonographic signs of cholecystitis or biliary ductal dilatation. Unremarkable  sonographic appearance of liver. Electronically Signed   By: Marlaine Hind M.D.   On: 01/28/2020 11:10    (Echo, Carotid, EGD, Colonoscopy, ERCP)    Subjective: Seen and examined Acute deterioration of mental status, patient groaning Appears altered, does not follow commands Tachypneic, tachycardic, consistent with sepsis Patient to transfer to [facility] for advanced endoscopy/ERCP  Discharge Exam: Vitals:   Mar 06, 2020 0638 March 06, 2020 0736  BP: 92/77 (!) 105/54  Pulse: 85 (!) 115  Resp: 20 20  Temp: 98.8 F (37.1 C) 98.9 F (37.2 C)  SpO2: 93% 90%   Vitals:   Mar 06, 2020 0545 03/06/20 0548 03-06-2020 0638 03/06/20 0736  BP: (!) 81/51 (!) 86/45 92/77 (!) 105/54  Pulse: 96 97 85 (!) 115  Resp:   20 20  Temp: 98.6 F (37 C)  98.8 F (37.1 C) 98.9 F (37.2 C)  TempSrc: Oral  Oral Axillary  SpO2: 94%  93% 90%  Weight:   74.8 kg   Height:        General: Mild distress.  Altered.  Does not follow commands Cardiovascular: Tachycardic, regular rhythm, no appreciable murmur Respiratory: Coarse breath sounds bilaterally/tachypnea Abdominal: Tender to palpation right upper quadrant Extremities: no edema, no cyanosis    The results of significant diagnostics from this hospitalization (including imaging, microbiology, ancillary and laboratory) are listed below for reference.     Microbiology: Recent Results (from the past 240 hour(s))  SARS CORONAVIRUS 2 (TAT 6-24 HRS) Nasopharyngeal Nasopharyngeal Swab     Status: None   Collection Time: 01/26/2020  4:03 PM   Specimen: Nasopharyngeal Swab  Result Value Ref Range Status   SARS Coronavirus 2 NEGATIVE NEGATIVE Final    Comment: (NOTE) SARS-CoV-2 target nucleic acids are NOT DETECTED. The SARS-CoV-2 RNA is generally detectable in upper and lower respiratory specimens during the acute phase of infection. Negative results do not preclude SARS-CoV-2 infection, do not rule out  co-infections with other pathogens, and should not  be used as the sole basis for treatment or other patient management decisions. Negative results must be combined with clinical observations, patient history, and epidemiological information. The expected result is Negative. Fact Sheet for Patients: SugarRoll.be Fact Sheet for Healthcare Providers: https://www.woods-mathews.com/ This test is not yet approved or cleared by the [place] FDA and  has been authorized for detection and/or diagnosis of SARS-CoV-2 by FDA under an Emergency Use Authorization (EUA). This EUA will remain  in effect (meaning this test can be used) for the duration of the COVID-19 declaration under Section 56 4(b)(1) of the Act, 21 U.S.C. section 360bbb-3(b)(1), unless the authorization is terminated or revoked sooner. Performed at [facility], [place] [address], [place], [place] [address]   Culture, blood (Routine X 2) w Reflex to ID Panel     Status: None   Collection Time: 02/01/2020  4:56 PM   Specimen: BLOOD  Result Value Ref Range Status   Specimen Description BLOOD BLOOD RIGHT HAND  Final   Special Requests   Final    BOTTLES DRAWN AEROBIC AND ANAEROBIC Blood Culture adequate volume   Culture   Final    NO GROWTH 5 DAYS Performed at [facility], [address] [place], [place] [address]    Report Status Feb 24, 2020 FINAL  Final  Culture, blood (Routine X 2) w Reflex to ID Panel     Status: None   Collection Time: 01/28/2020  4:56 PM   Specimen: BLOOD  Result Value Ref Range Status   Specimen Description BLOOD BLOOD LEFT HAND  Final   Special Requests   Final    BOTTLES DRAWN AEROBIC AND ANAEROBIC Blood Culture adequate volume   Culture   Final    NO GROWTH 5 DAYS Performed at [facility], [address] [place], [place] [address]    Report Status 24-Feb-2020 FINAL  Final  Urine Culture     Status: Abnormal   Collection Time: 01/21/2020 11:07 PM   Specimen: Urine, Random  Result  Value Ref Range Status   Specimen Description   Final    URINE, RANDOM Performed at [facility], [address] [place], [place] [address]    Special Requests   Final    NONE Performed at [facility], [address] [place], [place] [address]    Culture (A)  Final    <10,000 COLONIES/mL INSIGNIFICANT GROWTH Performed at [facility], [place] [address] [place], [place] [address]    Report Status 02/03/2020 FINAL  Final  MRSA PCR Screening     Status: None   Collection Time: 24-Feb-2020  9:50 AM   Specimen: Nasopharyngeal  Result Value Ref Range Status   MRSA by PCR NEGATIVE NEGATIVE Final    Comment:        The GeneXpert MRSA Assay (FDA approved for NASAL specimens only), is one component of a comprehensive MRSA colonization surveillance program. It is not intended to diagnose MRSA infection nor to guide or monitor treatment for MRSA infections. Performed at [facility], [address] [place], [place] [address]      Labs: BNP (last 3 results) No results for input(s): BNP in the last 8760 hours. Basic Metabolic Panel: Recent Labs  Lab 02/03/20 0658 02/03/2020 0828 02/05/20 0321 02/06/20 0451 02-24-2020 0526  NA 141 142 141 142 140  K 3.9 4.0 3.7 3.5 3.5  CL 110 109 111 109 107  CO2 24  24 23 24  19*  GLUCOSE 98 105* 99 85 115*  BUN 36* 29* 25* 20 35*  CREATININE 1.64* 1.47* 1.39* 1.38* 2.61*  CALCIUM 9.1 8.8* 8.4* 9.0 9.2  MG 2.0  --   --   --   --    Liver Function Tests: Recent Labs  Lab 02/03/20 0658 01/15/2020 0828 02/05/20 0321 02/06/20 0451 04-Mar-2020 0526  AST 24 29 25  34 305*  ALT 19 21 18 19  195*  ALKPHOS 57 56 49 46 187*  BILITOT 1.2 0.9 1.0 1.3* 4.9*  PROT 7.1 7.0 6.2* 6.4* 6.2*  ALBUMIN 3.6 3.7 3.3* 3.5 3.2*   Recent Labs  Lab 02/03/20 0658  LIPASE 40   Recent Labs  Lab 02/03/20 0658  AMMONIA 25   CBC: Recent Labs  Lab 02/03/20 0658 02/05/2020 0828 02/05/20 0321 02/06/20 0451 2020/03/04 0526  WBC  5.6 4.7 6.2 6.0 14.5*  NEUTROABS 3.7 3.5 4.5 4.5  --   HGB 11.9* 12.2* 10.0* 10.6* 11.8*  HCT 36.8* 38.2* 31.3* 33.0* 35.8*  MCV 91.1 91.6 91.8 89.7 88.8  PLT 108* 90* 99* 129* 132*   Cardiac Enzymes: No results for input(s): CKTOTAL, CKMB, CKMBINDEX, TROPONINI in the last 168 hours. BNP: Invalid input(s): POCBNP CBG: No results for input(s): GLUCAP in the last 168 hours. D-Dimer No results for input(s): DDIMER in the last 72 hours. Hgb A1c No results for input(s): HGBA1C in the last 72 hours. Lipid Profile No results for input(s): CHOL, HDL, LDLCALC, TRIG, CHOLHDL, LDLDIRECT in the last 72 hours. Thyroid function studies No results for input(s): TSH, T4TOTAL, T3FREE, THYROIDAB in the last 72 hours.  Invalid input(s): FREET3 Anemia work up No results for input(s): VITAMINB12, FOLATE, FERRITIN, TIBC, IRON, RETICCTPCT in the last 72 hours. Urinalysis No results found for: COLORURINE, APPEARANCEUR, [place], [place], [place], [place], [place], [place], PROTEINUR, UROBILINOGEN, NITRITE, LEUKOCYTESUR Sepsis Labs Invalid input(s): PROCALCITONIN,  WBC,  LACTICIDVEN Microbiology Recent Results (from the past 240 hour(s))  SARS CORONAVIRUS 2 (TAT 6-24 HRS) Nasopharyngeal Nasopharyngeal Swab     Status: None   Collection Time: 01/24/2020  4:03 PM   Specimen: Nasopharyngeal Swab  Result Value Ref Range Status   SARS Coronavirus 2 NEGATIVE NEGATIVE Final    Comment: (NOTE) SARS-CoV-2 target nucleic acids are NOT DETECTED. The SARS-CoV-2 RNA is generally detectable in upper and lower respiratory specimens during the acute phase of infection. Negative results do not preclude SARS-CoV-2 infection, do not rule out co-infections with other pathogens, and should not be used as the sole basis for treatment or other patient management decisions. Negative results must be combined with clinical observations, patient history, and epidemiological information. The expected result is  Negative. Fact Sheet for Patients: SugarRoll.be Fact Sheet for Healthcare Providers: https://www.woods-mathews.com/ This test is not yet approved or cleared by the [place] FDA and  has been authorized for detection and/or diagnosis of SARS-CoV-2 by FDA under an Emergency Use Authorization (EUA). This EUA will remain  in effect (meaning this test can be used) for the duration of the COVID-19 declaration under Section 56 4(b)(1) of the Act, 21 U.S.C. section 360bbb-3(b)(1), unless the authorization is terminated or revoked sooner. Performed at [facility], [place] [address] [place], [place] [address]   Culture, blood (Routine X 2) w Reflex to ID Panel     Status: None   Collection Time: 02/06/2020  4:56 PM   Specimen: BLOOD  Result Value Ref Range Status   Specimen Description BLOOD BLOOD RIGHT HAND  Final  Special Requests   Final    BOTTLES DRAWN AEROBIC AND ANAEROBIC Blood Culture adequate volume   Culture   Final    NO GROWTH 5 DAYS Performed at [facility], [address] [place], [place] [address]    Report Status 02/24/20 FINAL  Final  Culture, blood (Routine X 2) w Reflex to ID Panel     Status: None   Collection Time: 02/01/2020  4:56 PM   Specimen: BLOOD  Result Value Ref Range Status   Specimen Description BLOOD BLOOD LEFT HAND  Final   Special Requests   Final    BOTTLES DRAWN AEROBIC AND ANAEROBIC Blood Culture adequate volume   Culture   Final    NO GROWTH 5 DAYS Performed at [facility], [address], [place], [place] [address]    Report Status 02/24/20 FINAL  Final  Urine Culture     Status: Abnormal   Collection Time: 02/06/2020 11:07 PM   Specimen: Urine, Random  Result Value Ref Range Status   Specimen Description   Final    URINE, RANDOM Performed at [facility], [address] [place], [place] [address]    Special Requests   Final    NONE Performed at [facility], [address] [place], [place] [address]    Culture (A)  Final    <10,000 COLONIES/mL INSIGNIFICANT GROWTH Performed at [facility], [place] [address] [place], [place] [address]    Report Status 02/03/2020 FINAL  Final  MRSA PCR Screening     Status: None   Collection Time: 2020/02/24  9:50 AM   Specimen: Nasopharyngeal  Result Value Ref Range Status   MRSA by PCR NEGATIVE NEGATIVE Final    Comment:        The GeneXpert MRSA Assay (FDA approved for NASAL specimens only), is one component of a comprehensive MRSA colonization surveillance program. It is not intended to diagnose MRSA infection nor to guide or monitor treatment for MRSA infections. Performed at [facility], [address], [place], [place] [address]      Time coordinating discharge: Over 30 minutes  SIGNED:   Sidney Ace, MD  [facility] 02-24-20, 11:19 AM Pager   If 7PM-7AM, please contact night-coverage

## 2020-02-08 NOTE — Plan of Care (Signed)
  Problem: Safety: Goal: Ability to remain free from injury will improve Outcome: Progressing   

## 2020-02-08 NOTE — Death Summary Note (Addendum)
Upon arrival this morning patient noted to have become febrile overnight with elevated lactic acid.  Review of laboratory data demonstrated acute and marked elevation in liver function tests, bilirubin, white blood cell count.  Deterioration in kidney function also noted.  Clinical exam significant for diffusely tender abdomen and markedly decreased level of mentation.  The patient deteriorated very quickly as he was nearly stable almost at baseline yesterday.  Notified gastroenterology of patient's decline.  They promptly started the process of arranging transfer to [facility] for emergent ERCP.  Given the patient's status  He was moved to the stepdown unit.  I notified PCCM attending of clinical deterioration.  Patient was started on sepsis protocol with aggressive intravenous fluids and prompt initiation of IV Zosyn.  Despite our interventions patient continued to rapidly decline.  Blood pressure dropped rapidly despite aggressive intravenous fluid resuscitation.  Mental status continued to decline.  Patient's color became gray and ashen.  Respiratory status deteriorated quickly.  Patient was placed on nonrebreather.  Initial plans to transfer patient was along were canceled due to patient instability.  At time of arrival of transfer services blood pressure was cycled and found to be markedly low, 46/28.  At this time I called the patient's caregiver Jocelyn Lamer who was understandably upset but responded to bedside promptly.  She was able to arrive at bedside just prior to the patient passing.  Appreciate prompt response of GI and ICU teams.  Time of death: 1337/03/11  Ralene Muskrat MD

## 2020-02-08 NOTE — Progress Notes (Addendum)
GI Inpatient Follow-up Note  Subjective:  Patient seen in follow-up for choledocholithiasis. He is also s/p thrombectomy of extensive right lower extremity DVT by Dr. Lucky Cowboy on 02/25. He was originally seen as an inpatient consult earlier this week in the hospital by Dr. Alice Reichert and myself where his LFTs resolved and he was being monitored peripherally. Attending physician is asking [place] to reexamine the patient. Overnight patient developed a fever of 101.3 and become hypotensive, and on morning labs was found to have elevated LFTs: AST 305, ALT 195, alk phos 187, tbili 4.9, and WBC 14.5. He was transferred to stepdown unit and started on IV Zosyn.   Scheduled Inpatient Medications:  . apixaban  10 mg Oral BID   Followed by  . [START ON 02/12/2020] apixaban  5 mg Oral BID  . benzonatate  100 mg Oral TID  . levothyroxine  75 mcg Oral QAC breakfast  . pantoprazole  40 mg Oral Daily    Continuous Inpatient Infusions:   . lactated ringers 75 mL/hr at Mar 04, 2020 0640  . piperacillin-tazobactam (ZOSYN)  IV 3.375 g (03-04-2020 0823)    PRN Inpatient Medications:  acetaminophen **OR** acetaminophen, albuterol, guaiFENesin-dextromethorphan, haloperidol lactate, ondansetron (ZOFRAN) IV, traMADol  Review of Systems: Constitutional: Weight is stable.  Eyes: No changes in vision. ENT: No oral lesions, sore throat.  GI: see HPI.  Heme/Lymph: No easy bruising.  CV: No chest pain.  GU: No hematuria.  Integumentary: No rashes.  Neuro: No headaches.  Psych: No depression/anxiety.  Endocrine: No heat/cold intolerance.  Allergic/Immunologic: No urticaria.  Resp: No cough, SOB.  Musculoskeletal: No joint swelling.    Physical Examination: BP (!) 105/54 (BP Location: Right Arm)   Pulse (!) 115   Temp 98.9 F (37.2 C) (Axillary)   Resp 20   Ht 6' 2"  (1.88 m)   Wt 74.8 kg   SpO2 90%   BMI 21.17 kg/m  Gen: NAD, alert and oriented x 4 HEENT: PEERLA, EOMI, Neck: supple, no JVD or thyromegaly Chest:  CTA bilaterally, no wheezes, crackles, or other adventitious sounds CV: RRR, no m/g/c/r Abd: soft, NT, ND, +BS in all four quadrants; no HSM, guarding, ridigity, or rebound tenderness Ext: no edema, well perfused with 2+ pulses, Skin: no rash or lesions noted Lymph: no LAD  Data: Lab Results  Component Value Date   WBC 14.5 (H) 03/04/2020   HGB 11.8 (L) 2020/03/04   HCT 35.8 (L) 03-04-20   MCV 88.8 March 04, 2020   PLT 132 (L) 2020/03/04   Recent Labs  Lab 02/05/20 0321 02/06/20 0451 March 04, 2020 0526  HGB 10.0* 10.6* 11.8*   Lab Results  Component Value Date   NA 140 2020/03/04   K 3.5 04-Mar-2020   CL 107 03/04/2020   CO2 19 (L) Mar 04, 2020   BUN 35 (H) 2020/03/04   CREATININE 2.61 (H) 03-04-20   Lab Results  Component Value Date   ALT 195 (H) March 04, 2020   AST 305 (H) Mar 04, 2020   ALKPHOS 187 (H) March 04, 2020   BILITOT 4.9 (H) 03/04/2020   Recent Labs  Lab 02/03/20 1040  APTT 40*  INR 1.1   Assessment/Plan:  86 y/o Caucasian male with a PMH of HTN, hypothyroidism, BPH, COPD admitted for RUQ abd pain with imaging findings consistent with choledocholithiasis   1. Acute cholangitis - patient has fever, RUQ abdominal pain, jaundice and moderately elevated LFTs with AST 305, ALT 195, alk phos 187, tbili 4.9, and WBC 14.5  2. S/p thrombectomy of right lower extremity DVT 02/25    -  Patient's symptoms have acutely worsened overnight and are now consistent with acute cholangitis - He was correctly placed on IV antibiotics. Continue IV Zosyn.  - ERCP is indicated for biliary drainage since patient is febrile, hypotensive, and clinical signs of ascending cholangitis - ERCP is unable to be performed here at [facility]. I have spoken with [facility] on-call physician - Dr. Watt Climes who is in agreement with transfer to [facility] for urgent ERCP and further management - I have called patient's caregiver and HPO[facility] Alphonzo [place]) and given her an update and obtained consent for the  procedure. We discussed potential risks and complications of procedure, including but not limited to bleeding, pain, infection, post-ERCP pancreatitis, and death. She consents to proceed with transfer to [facility] and ERCP today.   Please call with questions or concerns.    Octavia Bruckner, PA-C [facility] [phone] (Cell)

## 2020-02-08 DEATH — deceased

## 2020-02-10 ENCOUNTER — Ambulatory Visit: Payer: PPO

## 2020-02-10 LAB — CULTURE, BLOOD (ROUTINE X 2)
